# Patient Record
Sex: Female | Born: 1935 | Race: Black or African American | Hispanic: No
Health system: Southern US, Community
[De-identification: ages and names within clinical notes are randomized; demographics above are authoritative.]

---

## 2004-09-06 ENCOUNTER — Emergency Department: Payer: Self-pay | Admitting: Emergency Medicine

## 2004-09-06 ENCOUNTER — Other Ambulatory Visit: Payer: Self-pay

## 2004-09-17 ENCOUNTER — Ambulatory Visit: Payer: Self-pay | Admitting: Pain Medicine

## 2004-09-23 ENCOUNTER — Ambulatory Visit: Payer: Self-pay | Admitting: Pain Medicine

## 2004-10-31 ENCOUNTER — Ambulatory Visit: Payer: Self-pay | Admitting: Pain Medicine

## 2004-11-11 ENCOUNTER — Ambulatory Visit: Payer: Self-pay | Admitting: Pain Medicine

## 2004-12-17 ENCOUNTER — Ambulatory Visit: Payer: Self-pay | Admitting: Pain Medicine

## 2005-02-11 ENCOUNTER — Emergency Department: Payer: Self-pay | Admitting: Unknown Physician Specialty

## 2005-02-11 ENCOUNTER — Ambulatory Visit: Payer: Self-pay | Admitting: Pain Medicine

## 2005-03-05 ENCOUNTER — Ambulatory Visit: Payer: Self-pay | Admitting: Pain Medicine

## 2005-03-28 ENCOUNTER — Ambulatory Visit: Payer: Self-pay | Admitting: Unknown Physician Specialty

## 2005-04-03 ENCOUNTER — Ambulatory Visit: Payer: Self-pay | Admitting: Pain Medicine

## 2005-04-09 ENCOUNTER — Ambulatory Visit: Payer: Self-pay | Admitting: Pain Medicine

## 2005-05-09 ENCOUNTER — Emergency Department: Payer: Self-pay | Admitting: Internal Medicine

## 2005-05-22 ENCOUNTER — Ambulatory Visit: Payer: Self-pay | Admitting: Pain Medicine

## 2005-05-28 ENCOUNTER — Ambulatory Visit: Payer: Self-pay | Admitting: Pain Medicine

## 2005-06-02 ENCOUNTER — Ambulatory Visit: Payer: Self-pay | Admitting: Pain Medicine

## 2005-07-08 ENCOUNTER — Ambulatory Visit: Payer: Self-pay | Admitting: Pain Medicine

## 2005-07-21 ENCOUNTER — Ambulatory Visit: Payer: Self-pay | Admitting: Pain Medicine

## 2005-08-13 ENCOUNTER — Inpatient Hospital Stay: Payer: Self-pay | Admitting: Internal Medicine

## 2005-08-13 ENCOUNTER — Other Ambulatory Visit: Payer: Self-pay

## 2005-08-15 ENCOUNTER — Other Ambulatory Visit: Payer: Self-pay

## 2005-09-04 ENCOUNTER — Ambulatory Visit: Payer: Self-pay | Admitting: Pain Medicine

## 2005-09-10 ENCOUNTER — Ambulatory Visit: Payer: Self-pay | Admitting: Pain Medicine

## 2005-10-10 ENCOUNTER — Inpatient Hospital Stay: Payer: Self-pay | Admitting: Internal Medicine

## 2005-10-10 ENCOUNTER — Other Ambulatory Visit: Payer: Self-pay

## 2005-10-11 ENCOUNTER — Other Ambulatory Visit: Payer: Self-pay

## 2005-10-13 ENCOUNTER — Other Ambulatory Visit: Payer: Self-pay

## 2005-10-13 ENCOUNTER — Emergency Department: Payer: Self-pay | Admitting: Emergency Medicine

## 2006-02-27 ENCOUNTER — Ambulatory Visit: Payer: Self-pay | Admitting: Internal Medicine

## 2006-12-22 ENCOUNTER — Ambulatory Visit: Payer: Self-pay | Admitting: Pain Medicine

## 2007-01-04 ENCOUNTER — Ambulatory Visit: Payer: Self-pay | Admitting: Pain Medicine

## 2007-02-09 ENCOUNTER — Ambulatory Visit: Payer: Self-pay | Admitting: Pain Medicine

## 2007-02-17 ENCOUNTER — Ambulatory Visit: Payer: Self-pay | Admitting: Pain Medicine

## 2007-03-18 ENCOUNTER — Ambulatory Visit: Payer: Self-pay | Admitting: Pain Medicine

## 2007-03-22 ENCOUNTER — Ambulatory Visit: Payer: Self-pay | Admitting: Pain Medicine

## 2007-04-04 IMAGING — RF DG UGI W/ KUB
1 series · 14 of 24 positions shown · non-contrast
Comparison: none

REASON FOR EXAM: Dysphagia and Gerd
COMMENTS:

[Series 1: run · 14 of 31 slices shown]
[im 1/31]
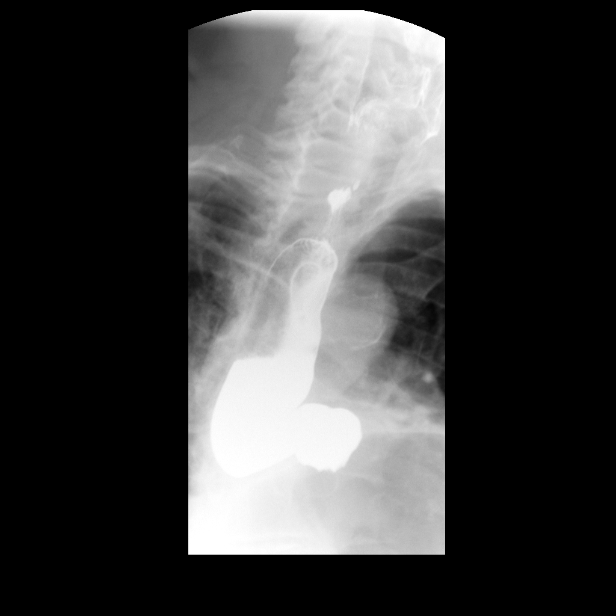
[im 3/31]
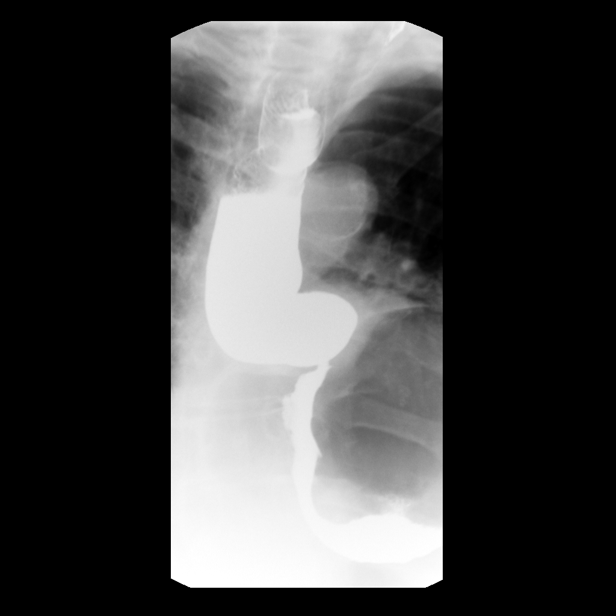
[im 6/31]
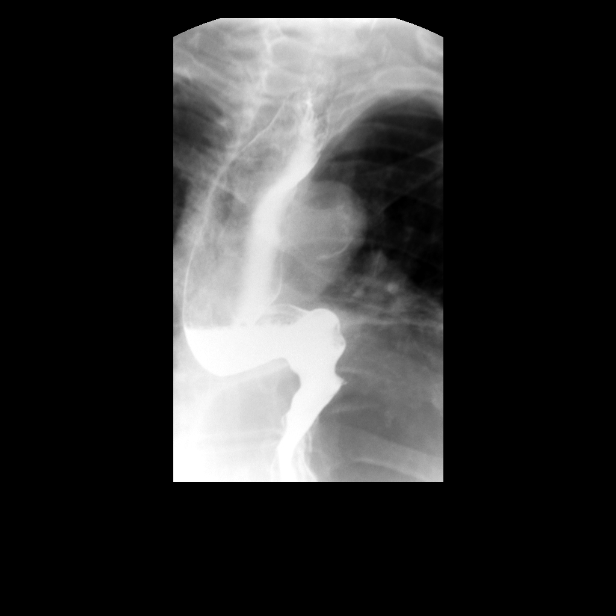
[im 8/31]
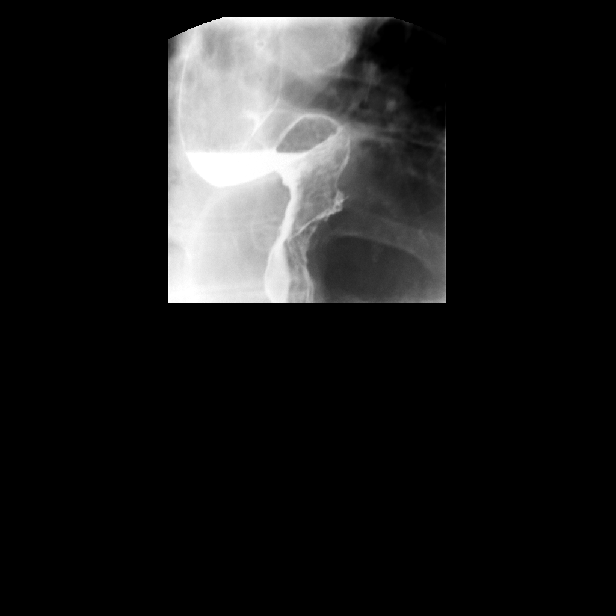
[im 10/31]
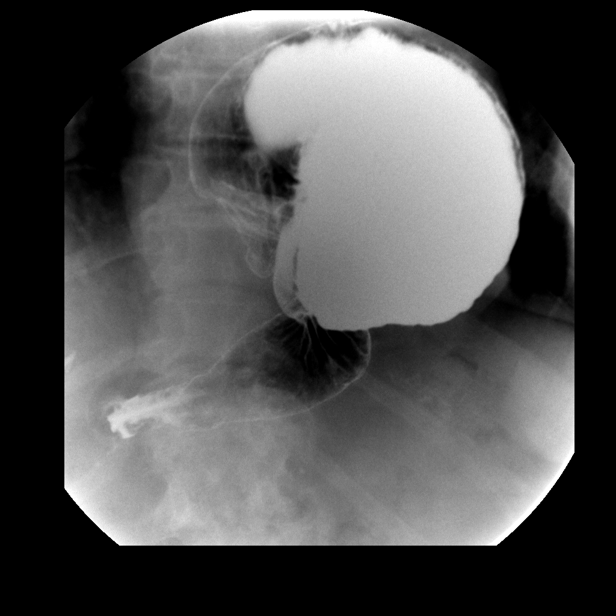
[im 12/31]
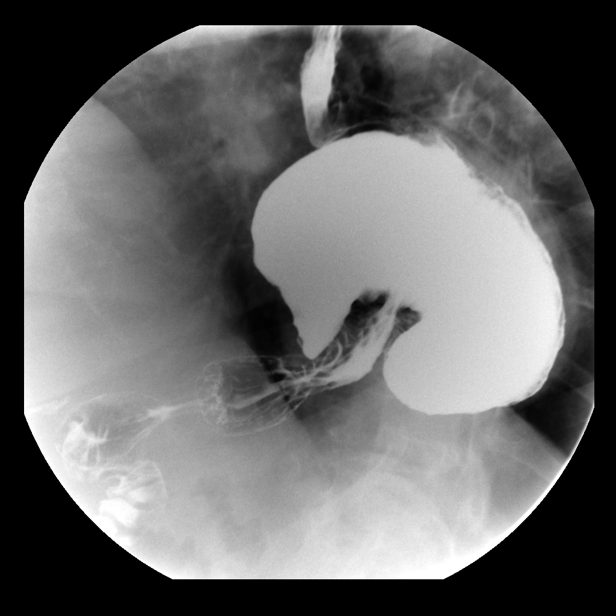
[im 15/31]
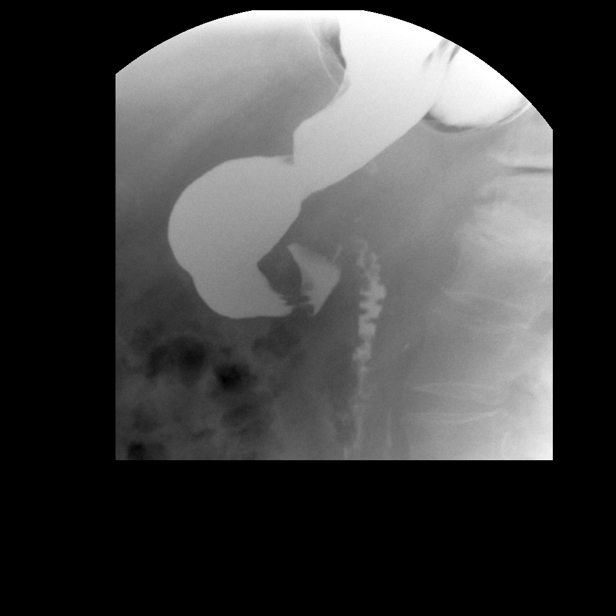
[im 16/31]
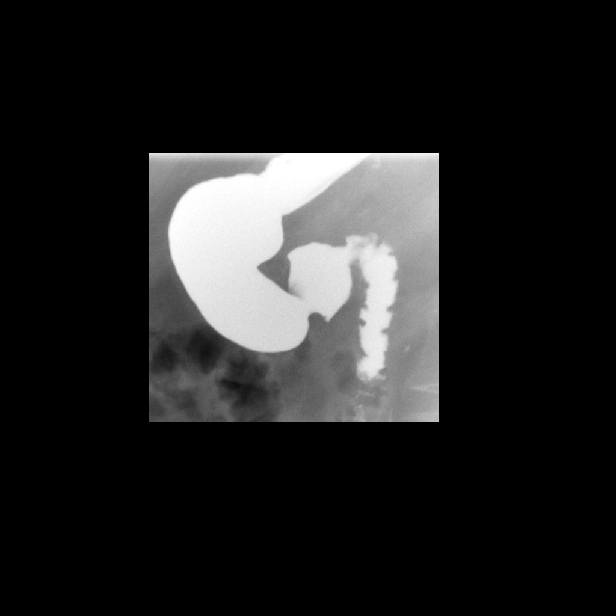
[im 19/31]
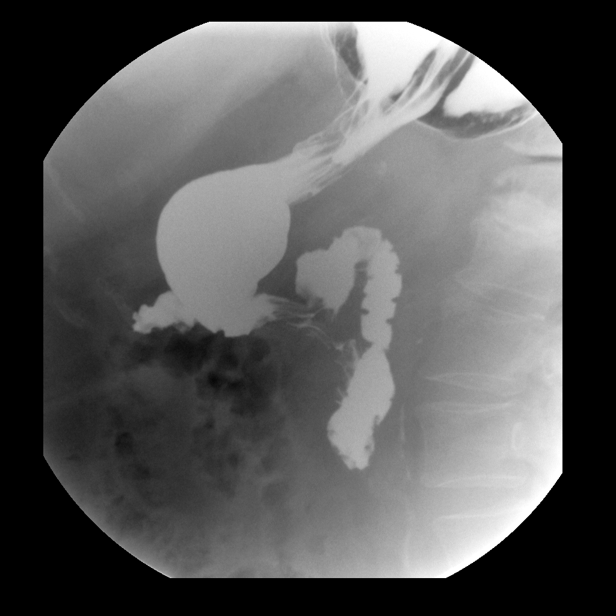
[im 21/31]
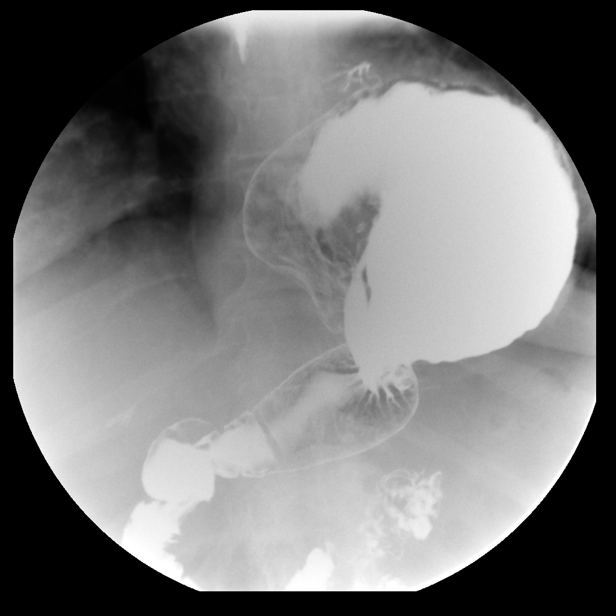
[im 24/31]
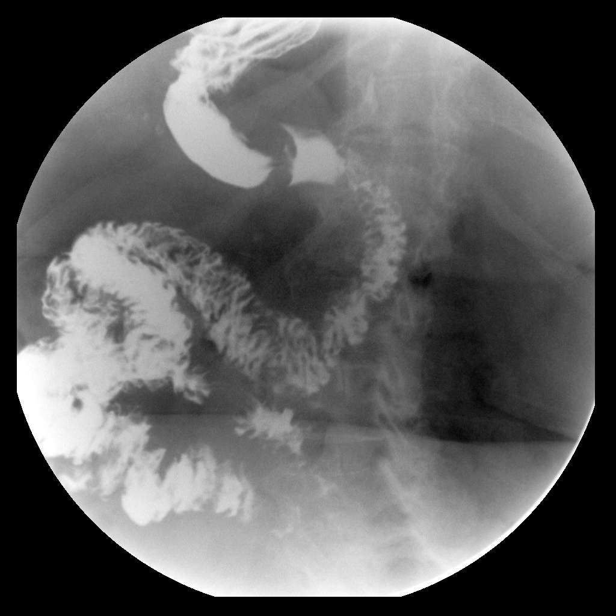
[im 25/31]
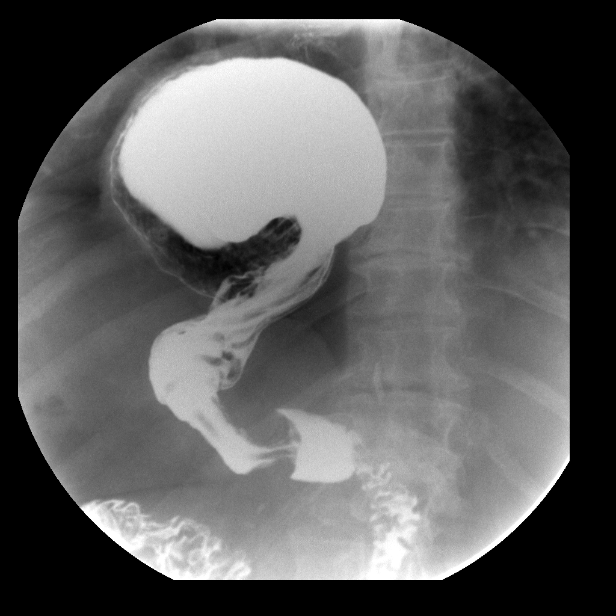
[im 28/31]
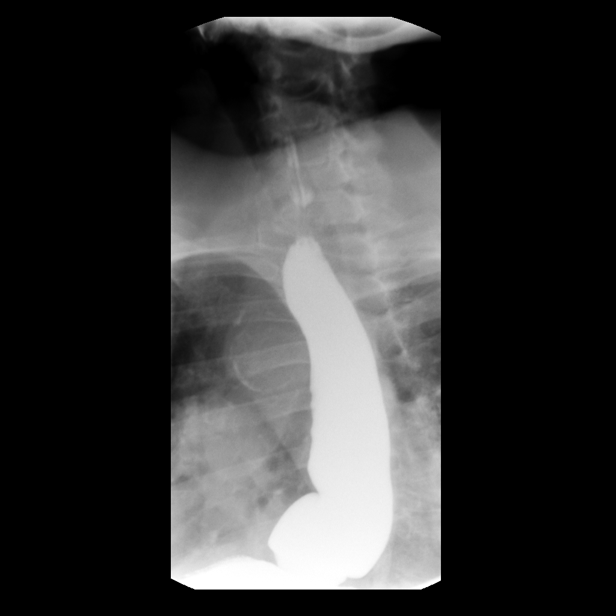
[im 31/31]
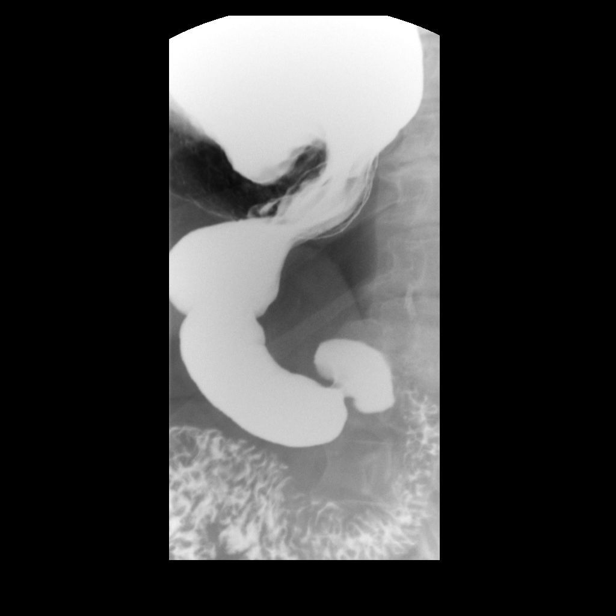

[14 of 24 positions shown; findings below may reference images not displayed]

PROCEDURE:     FL  - FL UPPER GI W/ BARIUM SWALLOW  - March 28, 2005  [DATE]

RESULT:        The patient was given oral barium status post administration
of effervescent crystals.  The esophagus was evaluated.  The esophagus is
patulous and it kinks within its distal portion.  This area of kinking is
secondary to a large hiatal hernia.  The hiatal hernia does demonstrate an
element of paraesophageal component.   The remaining evaluation of the
esophagus demonstrates no evidence of filling defects, strictural narrowing
or focal outpouchings.  The patient demonstrates marked esophageal
dysmotility with a poor to absent primary stripping wave, marked tertiary
contractions as well as marked to and fro motion within the esophagus during
swallowing.  There is appropriate relationship of the lower esophageal
sphincter.

Evaluation of the stomach as stated above demonstrates a large hiatal
hernia.  The cardiac fundus and portion of the body has herniated through
the esophageal hiatus of the diaphragm.  Visual evaluation of the stomach
demonstrates no gross radiographic abnormalities.  Specifically there is no
evidence of filling defect, strictural narrowing other than the narrowing at
the esophageal hiatus nor focal outpouchings.   Note, further evaluation
with direct visualization is recommended if clinically warranted.  The
duodenum and duodenal bulb demonstrate no radiographic abnormalities.
Appropriate rotation of the duodenum and appropriate placement of the
ligament of Treitz.
The patient demonstrated minimal gastroesophageal reflux with maneuvers were
performed to elicit reflux.
IMPRESSION: 1.     Large hiatal hernia as described above which appears to involve the
cardiac fundus and a portion of the body of the stomach.  This demonstrates
a paraesophageal component though the esophagus demonstrates primarily
kinking.
2.     The esophagus demonstrates marked dysmotility as well as evidence of
a patulous appearance.
3.     Minimal gastroesophageal reflux as described above.

## 2007-04-08 ENCOUNTER — Emergency Department: Payer: Self-pay | Admitting: Emergency Medicine

## 2007-04-08 ENCOUNTER — Other Ambulatory Visit: Payer: Self-pay

## 2007-04-20 ENCOUNTER — Ambulatory Visit: Payer: Self-pay | Admitting: Pain Medicine

## 2007-04-28 ENCOUNTER — Ambulatory Visit: Payer: Self-pay | Admitting: Pain Medicine

## 2007-06-08 ENCOUNTER — Ambulatory Visit: Payer: Self-pay | Admitting: Pain Medicine

## 2007-06-14 ENCOUNTER — Ambulatory Visit: Payer: Self-pay | Admitting: Pain Medicine

## 2007-07-15 ENCOUNTER — Ambulatory Visit: Payer: Self-pay | Admitting: Pain Medicine

## 2007-07-21 ENCOUNTER — Ambulatory Visit: Payer: Self-pay | Admitting: Pain Medicine

## 2007-08-30 ENCOUNTER — Ambulatory Visit: Payer: Self-pay | Admitting: Internal Medicine

## 2007-09-02 ENCOUNTER — Ambulatory Visit: Payer: Self-pay | Admitting: Pain Medicine

## 2007-09-15 ENCOUNTER — Ambulatory Visit: Payer: Self-pay | Admitting: Pain Medicine

## 2007-10-07 ENCOUNTER — Ambulatory Visit: Payer: Self-pay | Admitting: Pain Medicine

## 2007-11-09 ENCOUNTER — Ambulatory Visit: Payer: Self-pay | Admitting: Pain Medicine

## 2007-12-09 ENCOUNTER — Ambulatory Visit: Payer: Self-pay | Admitting: Pain Medicine

## 2008-01-06 ENCOUNTER — Ambulatory Visit: Payer: Self-pay | Admitting: Pain Medicine

## 2008-01-17 ENCOUNTER — Ambulatory Visit: Payer: Self-pay | Admitting: Pain Medicine

## 2008-02-02 ENCOUNTER — Ambulatory Visit: Payer: Self-pay | Admitting: Pain Medicine

## 2008-03-02 ENCOUNTER — Ambulatory Visit: Payer: Self-pay | Admitting: Pain Medicine

## 2008-03-08 ENCOUNTER — Ambulatory Visit: Payer: Self-pay | Admitting: Pain Medicine

## 2008-04-25 ENCOUNTER — Ambulatory Visit: Payer: Self-pay | Admitting: Pain Medicine

## 2008-06-08 ENCOUNTER — Ambulatory Visit: Payer: Self-pay | Admitting: Pain Medicine

## 2008-06-21 ENCOUNTER — Ambulatory Visit: Payer: Self-pay | Admitting: Pain Medicine

## 2008-07-05 ENCOUNTER — Ambulatory Visit: Payer: Self-pay | Admitting: Pain Medicine

## 2008-08-30 ENCOUNTER — Ambulatory Visit: Payer: Self-pay | Admitting: Pain Medicine

## 2008-10-03 ENCOUNTER — Ambulatory Visit: Payer: Self-pay | Admitting: Pain Medicine

## 2008-10-09 ENCOUNTER — Ambulatory Visit: Payer: Self-pay | Admitting: Pain Medicine

## 2009-03-21 ENCOUNTER — Inpatient Hospital Stay: Payer: Self-pay | Admitting: General Surgery

## 2009-03-31 ENCOUNTER — Ambulatory Visit: Payer: Self-pay | Admitting: Internal Medicine

## 2009-04-18 ENCOUNTER — Ambulatory Visit: Payer: Self-pay | Admitting: Internal Medicine

## 2009-05-01 ENCOUNTER — Ambulatory Visit: Payer: Self-pay | Admitting: Internal Medicine

## 2009-07-01 ENCOUNTER — Ambulatory Visit: Payer: Self-pay | Admitting: Internal Medicine

## 2009-07-18 ENCOUNTER — Ambulatory Visit: Payer: Self-pay | Admitting: Internal Medicine

## 2009-08-01 ENCOUNTER — Ambulatory Visit: Payer: Self-pay | Admitting: Internal Medicine

## 2009-08-02 ENCOUNTER — Ambulatory Visit: Payer: Self-pay | Admitting: Pain Medicine

## 2009-08-30 ENCOUNTER — Ambulatory Visit: Payer: Self-pay | Admitting: Pain Medicine

## 2009-09-05 ENCOUNTER — Ambulatory Visit: Payer: Self-pay | Admitting: Pain Medicine

## 2009-10-10 ENCOUNTER — Ambulatory Visit: Payer: Self-pay | Admitting: Pain Medicine

## 2009-10-31 ENCOUNTER — Ambulatory Visit: Payer: Self-pay | Admitting: Internal Medicine

## 2009-11-14 ENCOUNTER — Ambulatory Visit: Payer: Self-pay | Admitting: Internal Medicine

## 2009-12-01 ENCOUNTER — Ambulatory Visit: Payer: Self-pay | Admitting: Internal Medicine

## 2010-03-11 ENCOUNTER — Ambulatory Visit: Payer: Self-pay | Admitting: General Surgery

## 2010-04-24 ENCOUNTER — Ambulatory Visit: Payer: Self-pay | Admitting: Pain Medicine

## 2010-05-08 ENCOUNTER — Ambulatory Visit: Payer: Self-pay | Admitting: Pain Medicine

## 2010-05-28 ENCOUNTER — Ambulatory Visit: Payer: Self-pay | Admitting: Pain Medicine

## 2010-05-31 ENCOUNTER — Ambulatory Visit: Payer: Self-pay | Admitting: Internal Medicine

## 2010-06-24 ENCOUNTER — Ambulatory Visit: Payer: Self-pay | Admitting: Internal Medicine

## 2010-07-01 ENCOUNTER — Ambulatory Visit: Payer: Self-pay | Admitting: Internal Medicine

## 2010-07-24 ENCOUNTER — Ambulatory Visit: Payer: Self-pay | Admitting: Pain Medicine

## 2011-01-01 ENCOUNTER — Ambulatory Visit: Payer: Self-pay | Admitting: Urology

## 2011-01-10 ENCOUNTER — Ambulatory Visit: Payer: Self-pay | Admitting: Urology

## 2011-01-10 DIAGNOSIS — I10 Essential (primary) hypertension: Secondary | ICD-10-CM

## 2011-01-22 ENCOUNTER — Ambulatory Visit: Payer: Self-pay | Admitting: Urology

## 2011-04-02 ENCOUNTER — Ambulatory Visit: Payer: Self-pay

## 2011-05-21 ENCOUNTER — Ambulatory Visit: Payer: Self-pay | Admitting: Pain Medicine

## 2011-05-26 ENCOUNTER — Ambulatory Visit: Payer: Self-pay | Admitting: Pain Medicine

## 2011-06-09 ENCOUNTER — Ambulatory Visit: Payer: Self-pay | Admitting: Internal Medicine

## 2011-06-23 ENCOUNTER — Ambulatory Visit: Payer: Self-pay | Admitting: Pain Medicine

## 2011-07-02 ENCOUNTER — Ambulatory Visit: Payer: Self-pay | Admitting: Internal Medicine

## 2011-09-17 ENCOUNTER — Ambulatory Visit: Payer: Self-pay | Admitting: Pain Medicine

## 2011-09-22 ENCOUNTER — Ambulatory Visit: Payer: Self-pay | Admitting: Pain Medicine

## 2012-03-04 ENCOUNTER — Ambulatory Visit: Payer: Self-pay | Admitting: Unknown Physician Specialty

## 2012-04-07 ENCOUNTER — Ambulatory Visit: Payer: Self-pay | Admitting: Family

## 2012-06-08 ENCOUNTER — Ambulatory Visit: Payer: Self-pay | Admitting: Internal Medicine

## 2012-06-08 LAB — HEPATIC FUNCTION PANEL A (ARMC)
Alkaline Phosphatase: 104 U/L (ref 50–136)
Bilirubin, Direct: 0.1 mg/dL (ref 0.00–0.20)
Bilirubin,Total: 0.4 mg/dL (ref 0.2–1.0)
Total Protein: 6.7 g/dL (ref 6.4–8.2)

## 2012-06-08 LAB — CBC CANCER CENTER
Basophil %: 0.8 %
Eosinophil %: 4.9 %
HCT: 36.3 % (ref 35.0–47.0)
HGB: 11.6 g/dL — ABNORMAL LOW (ref 12.0–16.0)
MCH: 27.2 pg (ref 26.0–34.0)
MCHC: 31.9 g/dL — ABNORMAL LOW (ref 32.0–36.0)
MCV: 85 fL (ref 80–100)
Monocyte #: 0.4 x10 3/mm (ref 0.2–0.9)
Monocyte %: 8 %
Neutrophil #: 3.4 x10 3/mm (ref 1.4–6.5)
Neutrophil %: 64 %
RBC: 4.26 10*6/uL (ref 3.80–5.20)
WBC: 5.3 x10 3/mm (ref 3.6–11.0)

## 2012-06-08 LAB — CREATININE, SERUM
Creatinine: 1.99 mg/dL — ABNORMAL HIGH (ref 0.60–1.30)
EGFR (Non-African Amer.): 23 — ABNORMAL LOW

## 2012-07-01 ENCOUNTER — Ambulatory Visit: Payer: Self-pay | Admitting: Internal Medicine

## 2012-08-04 ENCOUNTER — Ambulatory Visit: Payer: Self-pay | Admitting: Pain Medicine

## 2012-09-01 ENCOUNTER — Ambulatory Visit: Payer: Self-pay | Admitting: Pain Medicine

## 2012-09-15 ENCOUNTER — Ambulatory Visit: Payer: Self-pay | Admitting: Pain Medicine

## 2013-02-27 ENCOUNTER — Emergency Department: Payer: Self-pay | Admitting: Emergency Medicine

## 2013-02-27 LAB — COMPREHENSIVE METABOLIC PANEL
Albumin: 3.2 g/dL — ABNORMAL LOW (ref 3.4–5.0)
Alkaline Phosphatase: 85 U/L (ref 50–136)
Anion Gap: 8 (ref 7–16)
Bilirubin,Total: 0.3 mg/dL (ref 0.2–1.0)
Co2: 24 mmol/L (ref 21–32)
Creatinine: 1.94 mg/dL — ABNORMAL HIGH (ref 0.60–1.30)
EGFR (African American): 27 — ABNORMAL LOW
Osmolality: 279 (ref 275–301)
Potassium: 4.5 mmol/L (ref 3.5–5.1)
SGOT(AST): 21 U/L (ref 15–37)
Total Protein: 6.6 g/dL (ref 6.4–8.2)

## 2013-02-27 LAB — LIPASE, BLOOD: Lipase: 95 U/L (ref 73–393)

## 2013-02-27 LAB — CBC
HCT: 30.4 % — ABNORMAL LOW (ref 35.0–47.0)
MCH: 27.5 pg (ref 26.0–34.0)
MCHC: 32.7 g/dL (ref 32.0–36.0)
MCV: 84 fL (ref 80–100)
WBC: 5.2 10*3/uL (ref 3.6–11.0)

## 2013-02-27 LAB — TROPONIN I: Troponin-I: <0.02 ng/mL

## 2013-04-20 ENCOUNTER — Ambulatory Visit: Payer: Self-pay | Admitting: Family

## 2013-05-31 ENCOUNTER — Ambulatory Visit: Payer: Self-pay | Admitting: Internal Medicine

## 2013-06-29 LAB — CREATININE, SERUM: Creatinine: 1.74 mg/dL — ABNORMAL HIGH (ref 0.60–1.30)

## 2013-06-29 LAB — CBC CANCER CENTER
Eosinophil %: 2.9 %
HGB: 11 g/dL — ABNORMAL LOW (ref 12.0–16.0)
Lymphocyte #: 0.9 x10 3/mm — ABNORMAL LOW (ref 1.0–3.6)
Lymphocyte %: 13.2 %
MCV: 84 fL (ref 80–100)
Monocyte %: 9.2 %
Neutrophil #: 5 x10 3/mm (ref 1.4–6.5)
Platelet: 319 x10 3/mm (ref 150–440)
RBC: 3.9 10*6/uL (ref 3.80–5.20)
RDW: 14 % (ref 11.5–14.5)
WBC: 6.8 x10 3/mm (ref 3.6–11.0)

## 2013-06-29 LAB — HEPATIC FUNCTION PANEL A (ARMC)
SGOT(AST): 21 U/L (ref 15–37)
SGPT (ALT): 28 U/L (ref 12–78)
Total Protein: 6.8 g/dL (ref 6.4–8.2)

## 2013-07-01 ENCOUNTER — Ambulatory Visit: Payer: Self-pay | Admitting: Internal Medicine

## 2013-07-23 ENCOUNTER — Inpatient Hospital Stay: Payer: Self-pay | Admitting: Internal Medicine

## 2013-07-23 LAB — URINALYSIS, COMPLETE
Nitrite: NEGATIVE
Ph: 6 (ref 4.5–8.0)
Protein: NEGATIVE
RBC,UR: 2 /HPF (ref 0–5)
Squamous Epithelial: 1
WBC UR: <1 /HPF (ref 0–5)

## 2013-07-23 LAB — COMPREHENSIVE METABOLIC PANEL
Albumin: 3.2 g/dL — ABNORMAL LOW (ref 3.4–5.0)
EGFR (Non-African Amer.): 34 — ABNORMAL LOW
Glucose: 154 mg/dL — ABNORMAL HIGH (ref 65–99)
Osmolality: 274 (ref 275–301)
Potassium: 4.3 mmol/L (ref 3.5–5.1)
Sodium: 134 mmol/L — ABNORMAL LOW (ref 136–145)
Total Protein: 6.7 g/dL (ref 6.4–8.2)

## 2013-07-23 LAB — CBC
HCT: 31.8 % — ABNORMAL LOW (ref 35.0–47.0)
HGB: 10.8 g/dL — ABNORMAL LOW (ref 12.0–16.0)
MCV: 83 fL (ref 80–100)
Platelet: 328 10*3/uL (ref 150–440)
RBC: 3.84 10*6/uL (ref 3.80–5.20)
WBC: 6.4 10*3/uL (ref 3.6–11.0)

## 2013-07-23 LAB — TROPONIN I: Troponin-I: <0.02 ng/mL

## 2013-07-23 LAB — CK TOTAL AND CKMB (NOT AT ARMC)
CK, Total: 97 U/L (ref 21–215)
CK-MB: 1.3 ng/mL (ref 0.5–3.6)

## 2013-07-24 DIAGNOSIS — I059 Rheumatic mitral valve disease, unspecified: Secondary | ICD-10-CM

## 2013-07-24 LAB — BASIC METABOLIC PANEL
BUN: 25 mg/dL — ABNORMAL HIGH (ref 7–18)
Calcium, Total: 9.1 mg/dL (ref 8.5–10.1)
Creatinine: 1.37 mg/dL — ABNORMAL HIGH (ref 0.60–1.30)
EGFR (Non-African Amer.): 35 — ABNORMAL LOW
Osmolality: 273 (ref 275–301)
Potassium: 4.4 mmol/L (ref 3.5–5.1)
Sodium: 129 mmol/L — ABNORMAL LOW (ref 136–145)

## 2013-07-24 LAB — CBC WITH DIFFERENTIAL/PLATELET
HCT: 32.1 % — ABNORMAL LOW (ref 35.0–47.0)
Lymphocyte %: 8.2 %
MCH: 27.5 pg (ref 26.0–34.0)
MCHC: 33 g/dL (ref 32.0–36.0)
Monocyte %: 1.1 %
Platelet: 330 10*3/uL (ref 150–440)
RBC: 3.85 10*6/uL (ref 3.80–5.20)
RDW: 14 % (ref 11.5–14.5)
WBC: 7.7 10*3/uL (ref 3.6–11.0)

## 2013-07-25 DIAGNOSIS — R079 Chest pain, unspecified: Secondary | ICD-10-CM

## 2013-07-25 LAB — CBC WITH DIFFERENTIAL/PLATELET
Basophil #: 0.1 10*3/uL (ref 0.0–0.1)
Basophil %: 0.3 %
Eosinophil #: 0 10*3/uL (ref 0.0–0.7)
HCT: 33 % — ABNORMAL LOW (ref 35.0–47.0)
HGB: 10.9 g/dL — ABNORMAL LOW (ref 12.0–16.0)
Lymphocyte %: 3.6 %
MCHC: 32.9 g/dL (ref 32.0–36.0)
MCV: 84 fL (ref 80–100)
Monocyte #: 0.6 x10 3/mm (ref 0.2–0.9)
Monocyte %: 3.6 %
Neutrophil #: 14.3 10*3/uL — ABNORMAL HIGH (ref 1.4–6.5)
Platelet: 335 10*3/uL (ref 150–440)
RBC: 3.93 10*6/uL (ref 3.80–5.20)
RDW: 13.9 % (ref 11.5–14.5)
WBC: 15.4 10*3/uL — ABNORMAL HIGH (ref 3.6–11.0)

## 2013-07-25 LAB — BASIC METABOLIC PANEL
Anion Gap: 11 (ref 7–16)
Calcium, Total: 9.3 mg/dL (ref 8.5–10.1)
Chloride: 95 mmol/L — ABNORMAL LOW (ref 98–107)
Co2: 22 mmol/L (ref 21–32)
Creatinine: 1.52 mg/dL — ABNORMAL HIGH (ref 0.60–1.30)
EGFR (African American): 36 — ABNORMAL LOW
Glucose: 313 mg/dL — ABNORMAL HIGH (ref 65–99)
Potassium: 4.6 mmol/L (ref 3.5–5.1)

## 2013-07-26 DIAGNOSIS — I509 Heart failure, unspecified: Secondary | ICD-10-CM

## 2013-07-26 LAB — CBC WITH DIFFERENTIAL/PLATELET
Basophil #: 0 10*3/uL (ref 0.0–0.1)
Eosinophil %: 0 %
HGB: 10.9 g/dL — ABNORMAL LOW (ref 12.0–16.0)
Lymphocyte %: 3.6 %
MCH: 27.4 pg (ref 26.0–34.0)
MCHC: 33.3 g/dL (ref 32.0–36.0)
MCV: 82 fL (ref 80–100)
Monocyte #: 0.6 x10 3/mm (ref 0.2–0.9)
Monocyte %: 3.8 %
Neutrophil #: 13.5 10*3/uL — ABNORMAL HIGH (ref 1.4–6.5)
Neutrophil %: 92.5 %
Platelet: 344 10*3/uL (ref 150–440)
RBC: 3.99 10*6/uL (ref 3.80–5.20)
RDW: 14 % (ref 11.5–14.5)

## 2013-07-26 LAB — BASIC METABOLIC PANEL
Anion Gap: 8 (ref 7–16)
BUN: 41 mg/dL — ABNORMAL HIGH (ref 7–18)
Calcium, Total: 9.2 mg/dL (ref 8.5–10.1)
Chloride: 95 mmol/L — ABNORMAL LOW (ref 98–107)
Co2: 26 mmol/L (ref 21–32)
EGFR (African American): 34 — ABNORMAL LOW
EGFR (Non-African Amer.): 30 — ABNORMAL LOW
Potassium: 4.2 mmol/L (ref 3.5–5.1)
Sodium: 129 mmol/L — ABNORMAL LOW (ref 136–145)

## 2013-07-27 DIAGNOSIS — R0602 Shortness of breath: Secondary | ICD-10-CM

## 2013-07-27 LAB — BASIC METABOLIC PANEL
Anion Gap: 7 (ref 7–16)
BUN: 50 mg/dL — ABNORMAL HIGH (ref 7–18)
Calcium, Total: 8.7 mg/dL (ref 8.5–10.1)
Chloride: 93 mmol/L — ABNORMAL LOW (ref 98–107)
Co2: 27 mmol/L (ref 21–32)
Creatinine: 1.73 mg/dL — ABNORMAL HIGH (ref 0.60–1.30)
Glucose: 239 mg/dL — ABNORMAL HIGH (ref 65–99)
Osmolality: 276 (ref 275–301)

## 2013-07-28 LAB — CULTURE, BLOOD (SINGLE)

## 2014-06-09 ENCOUNTER — Ambulatory Visit: Payer: Self-pay | Admitting: Internal Medicine

## 2014-06-29 ENCOUNTER — Ambulatory Visit: Payer: Self-pay | Admitting: Internal Medicine

## 2014-06-30 LAB — CBC CANCER CENTER
Basophil #: 0 x10 3/mm (ref 0.0–0.1)
Basophil %: 0.4 %
EOS ABS: 0.1 x10 3/mm (ref 0.0–0.7)
Eosinophil %: 1.6 %
HCT: 35.1 % (ref 35.0–47.0)
HGB: 11.2 g/dL — AB (ref 12.0–16.0)
LYMPHS ABS: 0.7 x10 3/mm — AB (ref 1.0–3.6)
Lymphocyte %: 7.6 %
MCH: 26 pg (ref 26.0–34.0)
MCHC: 31.8 g/dL — AB (ref 32.0–36.0)
MCV: 82 fL (ref 80–100)
MONOS PCT: 5.7 %
Monocyte #: 0.5 x10 3/mm (ref 0.2–0.9)
Neutrophil #: 7.8 x10 3/mm — ABNORMAL HIGH (ref 1.4–6.5)
Neutrophil %: 84.7 %
PLATELETS: 269 x10 3/mm (ref 150–440)
RBC: 4.3 10*6/uL (ref 3.80–5.20)
RDW: 14.8 % — AB (ref 11.5–14.5)
WBC: 9.2 x10 3/mm (ref 3.6–11.0)

## 2014-06-30 LAB — HEPATIC FUNCTION PANEL A (ARMC)
ALT: 47 U/L
Albumin: 2.8 g/dL — ABNORMAL LOW (ref 3.4–5.0)
Alkaline Phosphatase: 112 U/L
BILIRUBIN DIRECT: 0.1 mg/dL (ref 0.00–0.20)
Bilirubin,Total: 0.3 mg/dL (ref 0.2–1.0)
SGOT(AST): 28 U/L (ref 15–37)
TOTAL PROTEIN: 6 g/dL — AB (ref 6.4–8.2)

## 2014-06-30 LAB — CREATININE, SERUM
CREATININE: 2.23 mg/dL — AB (ref 0.60–1.30)
EGFR (African American): 23 — ABNORMAL LOW
EGFR (Non-African Amer.): 19 — ABNORMAL LOW

## 2014-07-01 ENCOUNTER — Ambulatory Visit: Payer: Self-pay | Admitting: Internal Medicine

## 2014-07-28 ENCOUNTER — Inpatient Hospital Stay: Payer: Self-pay | Admitting: Internal Medicine

## 2014-07-28 LAB — URINALYSIS, COMPLETE
BACTERIA: NONE SEEN
BILIRUBIN, UR: NEGATIVE
BLOOD: NEGATIVE
KETONE: NEGATIVE
LEUKOCYTE ESTERASE: NEGATIVE
Nitrite: NEGATIVE
PH: 6 (ref 4.5–8.0)
Protein: NEGATIVE
RBC,UR: <1 /HPF (ref 0–5)
Specific Gravity: 1.02 (ref 1.003–1.030)
Squamous Epithelial: 1
WBC UR: 2 /HPF (ref 0–5)

## 2014-07-28 LAB — CBC
HCT: 39 % (ref 35.0–47.0)
HGB: 12.1 g/dL (ref 12.0–16.0)
MCH: 25.8 pg — ABNORMAL LOW (ref 26.0–34.0)
MCHC: 31.1 g/dL — ABNORMAL LOW (ref 32.0–36.0)
MCV: 83 fL (ref 80–100)
PLATELETS: 209 10*3/uL (ref 150–440)
RBC: 4.7 10*6/uL (ref 3.80–5.20)
RDW: 15.9 % — ABNORMAL HIGH (ref 11.5–14.5)
WBC: 11.8 10*3/uL — AB (ref 3.6–11.0)

## 2014-07-28 LAB — TROPONIN I
TROPONIN-I: 0.1 ng/mL — AB
Troponin-I: 0.04 ng/mL
Troponin-I: 0.12 ng/mL — ABNORMAL HIGH

## 2014-07-28 LAB — COMPREHENSIVE METABOLIC PANEL
ALBUMIN: 2.7 g/dL — AB (ref 3.4–5.0)
ANION GAP: 10 (ref 7–16)
AST: 25 U/L (ref 15–37)
Alkaline Phosphatase: 122 U/L — ABNORMAL HIGH
BUN: 43 mg/dL — ABNORMAL HIGH (ref 7–18)
Bilirubin,Total: 0.5 mg/dL (ref 0.2–1.0)
CALCIUM: 9.2 mg/dL (ref 8.5–10.1)
CO2: 23 mmol/L (ref 21–32)
CREATININE: 2.16 mg/dL — AB (ref 0.60–1.30)
Chloride: 96 mmol/L — ABNORMAL LOW (ref 98–107)
EGFR (African American): 23 — ABNORMAL LOW
GFR CALC NON AF AMER: 20 — AB
GLUCOSE: 579 mg/dL — AB (ref 65–99)
Osmolality: 296 (ref 275–301)
POTASSIUM: 5.6 mmol/L — AB (ref 3.5–5.1)
SGPT (ALT): 47 U/L
Sodium: 129 mmol/L — ABNORMAL LOW (ref 136–145)
Total Protein: 5.9 g/dL — ABNORMAL LOW (ref 6.4–8.2)

## 2014-07-28 LAB — CK-MB: CK-MB: 6.5 ng/mL — ABNORMAL HIGH (ref 0.5–3.6)

## 2014-07-28 LAB — CK TOTAL AND CKMB (NOT AT ARMC)
CK, Total: 64 U/L
CK-MB: 7 ng/mL — ABNORMAL HIGH (ref 0.5–3.6)

## 2014-07-28 LAB — LIPASE, BLOOD: Lipase: 101 U/L (ref 73–393)

## 2014-07-29 LAB — BASIC METABOLIC PANEL
ANION GAP: 7 (ref 7–16)
BUN: 45 mg/dL — AB (ref 7–18)
CALCIUM: 8.1 mg/dL — AB (ref 8.5–10.1)
CO2: 24 mmol/L (ref 21–32)
CREATININE: 1.98 mg/dL — AB (ref 0.60–1.30)
Chloride: 108 mmol/L — ABNORMAL HIGH (ref 98–107)
EGFR (African American): 26 — ABNORMAL LOW
EGFR (Non-African Amer.): 22 — ABNORMAL LOW
GLUCOSE: 70 mg/dL (ref 65–99)
OSMOLALITY: 288 (ref 275–301)
POTASSIUM: 4.3 mmol/L (ref 3.5–5.1)
SODIUM: 139 mmol/L (ref 136–145)

## 2014-07-29 LAB — CK TOTAL AND CKMB (NOT AT ARMC)
CK, Total: 52 U/L
CK-MB: 6 ng/mL — ABNORMAL HIGH (ref 0.5–3.6)

## 2014-07-29 LAB — TROPONIN I: TROPONIN-I: 0.12 ng/mL — AB

## 2014-07-29 LAB — CLOSTRIDIUM DIFFICILE(ARMC)

## 2014-07-30 LAB — CBC WITH DIFFERENTIAL/PLATELET
BASOS ABS: 0 10*3/uL (ref 0.0–0.1)
BASOS PCT: 0.1 %
EOS ABS: 0.1 10*3/uL (ref 0.0–0.7)
Eosinophil %: 1.3 %
HCT: 33.6 % — AB (ref 35.0–47.0)
HGB: 10.9 g/dL — ABNORMAL LOW (ref 12.0–16.0)
LYMPHS PCT: 7.6 %
Lymphocyte #: 0.6 10*3/uL — ABNORMAL LOW (ref 1.0–3.6)
MCH: 26.7 pg (ref 26.0–34.0)
MCHC: 32.5 g/dL (ref 32.0–36.0)
MCV: 82 fL (ref 80–100)
MONOS PCT: 5.2 %
Monocyte #: 0.4 x10 3/mm (ref 0.2–0.9)
NEUTROS ABS: 6.4 10*3/uL (ref 1.4–6.5)
Neutrophil %: 85.8 %
Platelet: 155 10*3/uL (ref 150–440)
RBC: 4.08 10*6/uL (ref 3.80–5.20)
RDW: 16.6 % — ABNORMAL HIGH (ref 11.5–14.5)
WBC: 7.4 10*3/uL (ref 3.6–11.0)

## 2014-07-30 LAB — COMPREHENSIVE METABOLIC PANEL
ALBUMIN: 2 g/dL — AB (ref 3.4–5.0)
ALK PHOS: 73 U/L
ALT: 33 U/L
ANION GAP: 9 (ref 7–16)
BUN: 33 mg/dL — ABNORMAL HIGH (ref 7–18)
Bilirubin,Total: 0.4 mg/dL (ref 0.2–1.0)
CALCIUM: 7.8 mg/dL — AB (ref 8.5–10.1)
CREATININE: 2.34 mg/dL — AB (ref 0.60–1.30)
Chloride: 107 mmol/L (ref 98–107)
Co2: 21 mmol/L (ref 21–32)
EGFR (African American): 21 — ABNORMAL LOW
GFR CALC NON AF AMER: 18 — AB
GLUCOSE: 95 mg/dL (ref 65–99)
Osmolality: 281 (ref 275–301)
Potassium: 4.4 mmol/L (ref 3.5–5.1)
SGOT(AST): 26 U/L (ref 15–37)
Sodium: 137 mmol/L (ref 136–145)
TOTAL PROTEIN: 4.8 g/dL — AB (ref 6.4–8.2)

## 2014-07-31 LAB — BASIC METABOLIC PANEL
ANION GAP: 5 — AB (ref 7–16)
BUN: 30 mg/dL — ABNORMAL HIGH (ref 7–18)
CHLORIDE: 108 mmol/L — AB (ref 98–107)
Calcium, Total: 7.9 mg/dL — ABNORMAL LOW (ref 8.5–10.1)
Co2: 24 mmol/L (ref 21–32)
Creatinine: 2.08 mg/dL — ABNORMAL HIGH (ref 0.60–1.30)
EGFR (African American): 25 — ABNORMAL LOW
EGFR (Non-African Amer.): 21 — ABNORMAL LOW
Glucose: 179 mg/dL — ABNORMAL HIGH (ref 65–99)
OSMOLALITY: 284 (ref 275–301)
Potassium: 4.6 mmol/L (ref 3.5–5.1)
Sodium: 137 mmol/L (ref 136–145)

## 2014-08-01 LAB — CREATININE, SERUM
Creatinine: 1.85 mg/dL — ABNORMAL HIGH
EGFR (African American): 28 — ABNORMAL LOW
EGFR (Non-African Amer.): 24 — ABNORMAL LOW

## 2014-08-02 LAB — CULTURE, BLOOD (SINGLE)

## 2014-08-03 LAB — STOOL CULTURE

## 2015-01-28 ENCOUNTER — Emergency Department: Payer: Self-pay | Admitting: Emergency Medicine

## 2015-03-23 NOTE — Consult Note (Signed)
General Aspect 81 year old white female with history of hypertension, diabetes, chronic kidney disease stage III, history of COPD/asthma, sleep apnea, who uses CPAP on regular basis at nighttime, presents with progressive cough, and SOB.   Sx presented a few weeks ago, worse recently.  acute onset of shortness of breath the day of admission. on arrival,  she was  tachypneic and tachycardic and was  placed on  BiPAP.  She denies any abdominal pain, nausea, vomiting or diarrhea. Denies any urinary symptoms. Sh has been taking her inhalers at home with increasing frequency with minimal help in her breathing.    PAST MEDICAL HISTORY:  1.  COPD/asthma.  2.  Hypertension.  3.  Diabetes.  4.  Chronic kidney disease stage III. 5.  History left breast cancer status post mastectomy in 2010.  6.  Morbid obesity.  7.  GERD.  8.  Diminished hearing.  9.  Right-sided sciatica.  10.  Depression.  11.  Osteoarthritis.  12.  History of questionable TIA.   PAST SURGICAL HISTORY:  1.  Status post mastectomy.  2.  Status post bilateral cataract surgery.  3.  Bilateral total knee replacement.  4.  Status post stent to left ureter.   ALLERGIES: CIPRO, PAXIL AND CHEESE   Present Illness . SOCIAL HISTORY: No history of smoking or drinking. Lives at home with her daughter.   Family hx:   mother with CVA, otherwise no CAD   Physical Exam:  GEN well developed, well nourished, no acute distress   HEENT hearing intact to voice, moist oral mucosa   NECK supple   RESP normal resp effort  clear BS   CARD Regular rate and rhythm  Murmur   Murmur Systolic   ABD denies tenderness  soft   LYMPH negative neck   EXTR negative edema   SKIN normal to palpation   NEURO motor/sensory function intact   PSYCH alert, A+O to time, place, person, good insight        Admit Diagnosis:   ACUTE RENAL FAILURE: Onset Date: 25-Jul-2013, Status: Active, Description: ACUTE RENAL FAILURE  Home  Medications: Medication Instructions Status  O2 @ HS with CPAP  Active  Colace 100 mg oral capsule 1 cap(s) orally once a day, As Needed for softening stools. Active  acetaminophen 325 mg oral tablet 2 tabs ($Remov'650mg'LFaBcb$ ) orally every 4 hours as needed for pain/fever. Active  Dex4 oral tablet, chewable 4 tab(s) orally once IF SUGAR LESS THAN 70 Active  Advair Diskus 250 mcg-50 mcg inhalation powder 1 puff(s) inhaled 2 times a day Active  Ventolin HFA CFC free 90 mcg/inh inhalation aerosol 2 puff(s) inhaled 4 times a day as needed for shortness of breath/ wheezing.  Active  Carafate 1 g oral tablet 1 tab(s) orally 2 times a day Active  guaiFENesin 100 mg/5 mL oral liquid 5 milliliter(s) orally every 4 hours, As Needed for cough Active  optive - ophthalmic solution 1 drop(s) to each affected eye 3 times a day Active  omeprazole 20 mg oral delayed release capsule 1 cap(s) orally 2 times a day Active  Mylanta 400 mg-400 mg-40 mg/5 mL oral suspension 5 milliliter(s) orally 3 times a day, As Needed - for Indigestion, Heartburn Active  Voltaren Topical 1% topical gel Apply topically to affected area every 6 hours, As Needed - for Pain Active  Arimidex 1 mg tablet 1 tab(s) orally once a day (in the morning) Active  aspirin 81 mg oral tablet 1 tab(s) orally once a day  Active  Ativan 0.5 mg oral tablet 0.5 tab (0.25mg ) orally 2 times a day as needed for anxiety, nervousness Active  Caltrate 600 with D tablet 600 mg-400 intl units 1 tab(s) orally 2 times a day Active  Lantus 100 units/mL subcutaneous solution 25 unit(s) subcutaneous once a day Active  Lidoderm 5% topical film Apply topically to affected area once a day as needed for pain *12 hours on and 12 hours off* Active  Lumigan 0.03% ophthalmic solution 1 drop(s) to each affected eye once a day (at bedtime) Active  polyethylene glycol 3350 - oral powder for reconstitution 17 gram(s) in 8oz of fluid and drink orally once a day as needed for constipation.  Active  Metoprolol Succinate ER 50 mg oral tablet, extended release 1 tab(s) orally once a day (in the morning) Active  simvastatin 20 mg oral tablet 1 tab(s) orally once a day (at bedtime) Active  Zofran ODT 4 mg oral tablet, disintegrating 1 tab(s) orally every 6 hours as needed for nausea, vomiting. Active   Lab Results:  Routine Chem:  25-Aug-14 04:38   Glucose, Serum  313  BUN  38  Creatinine (comp)  1.52  Sodium, Serum  128  Potassium, Serum 4.6  Chloride, Serum  95  CO2, Serum 22  Calcium (Total), Serum 9.3  Anion Gap 11  Osmolality (calc) 278  eGFR (African American)  36  eGFR (Non-African American)  31 (eGFR values <27mL/min/1.73 m2 may be an indication of chronic kidney disease (CKD). Calculated eGFR is useful in patients with stable renal function. The eGFR calculation will not be reliable in acutely ill patients when serum creatinine is changing rapidly. It is not useful in  patients on dialysis. The eGFR calculation may not be applicable to patients at the low and high extremes of body sizes, pregnant women, and vegetarians.)  Routine Hem:  25-Aug-14 04:38   WBC (CBC)  15.4  RBC (CBC) 3.93  Hemoglobin (CBC)  10.9  Hematocrit (CBC)  33.0  Platelet Count (CBC) 335  MCV 84  MCH 27.6  MCHC 32.9  RDW 13.9  Neutrophil % 92.5  Lymphocyte % 3.6  Monocyte % 3.6  Eosinophil % 0.0  Basophil % 0.3  Neutrophil #  14.3  Lymphocyte #  0.6  Monocyte # 0.6  Eosinophil # 0.0  Basophil # 0.1 (Result(s) reported on 25 Jul 2013 at 05:28AM.)   EKG:  Interpretation EKG shows NSR with rate 75 bpm, LBBB   Radiology Results: Cardiology:    24-Aug-14 11:36, Echo Doppler  Echo Doppler   REASON FOR EXAM:      COMMENTS:       PROCEDURE: [facility] - ECHO DOPPLER COMPLETE(TRANSTHOR)  - Jul 24 2013 11:36AM     RESULT: Echocardiogram Report    Patient Name:   SNIGDHA HOWSER Date of Exam: 07/24/2013  Medical Rec #:  [id number]        Custom1:  Date ofBirth:  07-10-1927     Height:        60.0 in  Patient Age:    81 years      Weight:       219.0 lb  Patient Gender: F             BSA:          1.94 m??    Indications: SOB  Sonographer:    Nestor Ramp RCS  Referring Phys: [place] Bilberry, H    Summary:   1. Challenging image quality.  2. Left ventricular ejection fraction, by visual estimation, is 30 to   35%.   3. Moderate diffuse hypokinesis. Unable to exclude region wall motion   abnormality. Images suggestive of inferior and septal wall hypokinesis.   Anterior and lateral walls motion best preserved.   4. Normal right ventricular size and systolic function.   5. Mildly dilated left atrium.   6. Mild mitral valve regurgitation.   7. Mild tricuspid regurgitation.   8. Moderately elevated pulmonary artery systolic pressure.  2D AND M-MODE MEASUREMENTS (normal ranges within parentheses):  Left Ventricle:          Normal  IVSd (2D):      0.64 cm (0.7-1.1)  LVPWd (2D):     0.68 cm (0.7-1.1) Aorta/LA:                  Normal  LVIDd (2D):    4.62 cm (3.4-5.7) Aortic Root (2D): 2.70 cm (2.4-3.7)  LVIDs (2D):     4.06 cm           Left Atrium (2D): 4.60 cm (1.9-4.0)  LV FS (2D):     12.1 %   (>25%)  LV EF (2D):     26.3 %   (>50%)                                    Right Ventricle:                                 RVd (2D):        9.41 cm  LV SYSTOLIC FUNCTION BY 2D PLANIMETRY (MOD):  EF-A4C View: 33.8 % EF-A2C View: 32.2 % EF-Biplane: 74.0 %  LV DIASTOLIC FUNCTION:  MV Peak E: 1.33 m/s E/e' Ratio: 16.40  MV Peak A: 0.90 m/s Decel Time: 197 msec  E/A Ratio: 1.47  SPECTRAL DOPPLER ANALYSIS (where applicable):  Mitral Valve:  MV P1/2 Time: 57.13 msec  MV Area, PHT: 3.85 cm??  Tricuspid Valve and PA/RV Systolic Pressure: TR Max Velocity: 3.42 m/s RA   Pressure: 5 mmHg RVSP/PASP: 51.9 mmHg    PHYSICIAN INTERPRETATION:  Left Ventricle: The left ventricular internal cavity size was normal. LV   posterior wall thickness was normal. Left ventricular ejection  fraction,   by visual estimation, is 30 to 35%.  Right Ventricle: Normal right ventricular size, wall thickness, and   systolic function. The right ventricular size is normal. Global RV   systolic function is normal.  Left Atrium: The left atrium is mildly dilated.  Right Atrium: The right atrium is normal in size.  Pericardium: There is no evidence of pericardial effusion.  Mitral Valve: The mitral valve is normal in structure. Mild mitral valve   regurgitation is seen.  Tricuspid Valve: The tricuspid valve is normal. Mild tricuspid   regurgitation is visualized. The tricuspid regurgitant velocity is 3.42   m/s, and with an assumed right atrial pressure of 5 mmHg, the estimated   right ventricular systolic pressure is moderately elevated at 51.9 mmHg.  Aortic Valve: Mild aortic valve sclerosis is present, with no evidence of   aortic valve stenosis. No evidence of aortic valve regurgitation is seen.  Pulmonic Valve: The pulmonic valve is normal.  Aorta: The aortic root is normal in size and structure.    [id number] Ida Rogue MD  Electronically signed by [id number] Christia Reading  Bellina Tokarczyk MD  Signature Date/Time: 07/25/2013/8:55:30 AM    *** Final ***    IMPRESSION: .        Verified By: Minna Merritts, M.D., MD    Paxil: Alt Ment Status  Cipro: Hives  Cheese: N/V/Diarrhea  Vital Signs/Nurse's Notes: **Vital Signs.:   25-Aug-14 09:50  Vital Signs Type Q 4hr  Temperature Temperature (F) 98.5  Celsius 36.9  Temperature Source oral  Pulse Pulse 76  Respirations Respirations 20  Systolic BP Systolic BP 852  Diastolic BP (mmHg) Diastolic BP (mmHg) 79  Mean BP 110  Pulse Ox % Pulse Ox % 99  Pulse Ox Activity Level  At rest  Oxygen Delivery 2L    Impression 81 year old white female with history of hypertension, diabetes, chronic kidney disease stage III, history of COPD/asthma, sleep apnea, who uses CPAP on regular basis at nighttime, presents with progressive cough, and SOB.   1)  Shortness of breath suspect multifactorial:  acute on chronic systolic and diastolic CHF, being treated for COPD (though no smoking hx) --Moderate pulmonary HTN on echo, also with EF 30% Suspect underlying CAD --Would continue gentle diuresis  (given her renal dysfunction) For rise in creatinine, could decrease dosing of lasix  2) HTN: May benefit from additional of other agents such as amlodipine   3) Cardiomyopathy: suspect ischemic. Long hx of diabetes: long discussion with patient and daughter They prefer medical management at this time, rather than stress testing and cardiac cath --Could rediscuss with patient as we go, reconsider additional testing if she has worsening sx --continue asa, b-blocker, statin  4) Diabetes: per medfical service  5) OSA: on CPAP   Electronic Signatures: Ida Rogue (MD)  (Signed 25-Aug-14 19:44)  Authored: General Aspect/Present Illness, History and Physical Exam, Health Issues, Home Medications, Labs, EKG , Radiology, Allergies, Vital Signs/Nurse's Notes, Impression/Plan   Last Updated: 25-Aug-14 19:44 by Ida Rogue (MD)

## 2015-03-23 NOTE — H&P (Signed)
PATIENT NAME:  Andrea Vaughan, [name] MR#:  [id number] DATE OF BIRTH:  1927-07-21  DATE OF ADMISSION:  07/23/2013  PRIMARY CARE PROVIDER: Dr. Alberteen SpindleAllen Colette   EMERGENCY ROOM PHYSICIAN:  Dr.  Minna AntisKevin Paduchowski   CHIEF COMPLAINT: Shortness of breath, acute respiratory failure.   HISTORY OF PRESENT ILLNESS: The patient is an 81 year old white female with history of hypertension, diabetes, chronic kidney disease stage III, history of COPD/asthma, sleep apnea, who uses CPAP on regular basis at nighttime, presents with progressive cough, ongoing for the past few weeks. The patient also started developing acute onset of shortness of breath this morning. When patient arrived, she was very tachypneic and tachycardic. She had to be placed on a BiPAP. The patient reports no fevers or chills. She does report occasional chest pain on the left side of her chest. The patient reports that she has also fallen a few times over the past month with activity. She denies any abdominal pain, nausea, vomiting or diarrhea. Denies any urinary symptoms. She does complain of wheezing.   PAST MEDICAL HISTORY:  Significant for:  1.  COPD/asthma.  2.  Hypertension.  3.  Diabetes.  4.  Chronic kidney disease stage III. 5.  History left breast cancer status post mastectomy in 2010.  6.  Morbid obesity.  7.  GERD.  8.  Diminished hearing.  9.  Right-sided sciatica.  10.  Depression.  11.  Osteoarthritis.  12.  History of questionable TIA.   PAST SURGICAL HISTORY:  1.  Status post mastectomy.  2.  Status post bilateral cataract surgery.  3.  Bilateral total knee replacement.  4.  Status post stent to left ureter.   ALLERGIES: CIPRO, PAXIL AND CHEESE    CURRENT MEDICATIONS: At home Tylenol 650 q. 4 p.r.n. for pain, Advair 250/50 INH b.i.d., albuterol 2 puffs 4 times a day as needed, Arimidex 1 mg daily, aspirin 81 mg 1 tab p.o. daily, Ativan 0.5, one-half tab p.o. b.i.d. as needed, Caltrate 600 mg 1 tab p.o. b.i.d., Carafate 1  gram b.i.d., Colace 100 one tab p.o. daily p.r.n., Dex4 four tablets if blood sugar less than 70, guaifenesin 5 mL q. 4 p.r.n., Lantus 25 at bedtime, Lidoderm patch as needed, Lumigan 0.03% ophthalmic solution 1 drop to each affected eye at bedtime, Mylanta 5 mL t.i.d., nystatin apply to topical areas 2 times a day, omeprazole 20 mg one tab p.o. b.i.d., Optive ophthalmic solution 1 drop to affected eye 3 times a day, Pepto-Bismol 2 tabs 4 times a day as needed, MiraLAX 17 grams daily, Prozac 40 daily, Toprol-XL 50 daily, Ventolin 2 puffs 4 times a day as needed,  Voltaren gel q. 6 p.r.n., Zocor 20.   SOCIAL HISTORY: No history of smoking or drinking. Lives at home with her daughter.   REVIEW OF SYSTEMS:  CONSTITUTIONAL: Denies any fevers. Complains of fatigue and weakness. No weight loss. No weight gain.  EYES: No blurred or double vision. Has history of cataracts. The patient has conjunctival hemorrhage in the right eye since being here in the ED.  ENT: Denies any tinnitus, ear pain. He has some hearing loss. No seasonal or year-round allergies. No epistaxis. No difficulty swallowing.  RESPIRATORY: Complains of dry cough, wheezing. No hemoptysis. Has chronic dyspnea. Has a history of asthma/COPD. No history of smoking.  CARDIOVASCULAR: Complains of occasional left-sided chest pain. No orthopnea. Occasional edema. No arrhythmia. Complains of dyspnea on exertion. No palpitations.  GASTROINTESTINAL: No nausea, vomiting, diarrhea. No abdominal pain. No hematemesis.  GENITOURINARY: Denies any dysuria, hematuria, renal calculus or frequency.  ENDOCRINE: Denies any polyuria, nocturia or thyroid problems.  HEMATOLOGIC AND LYMPHATIC: Denies any major bruisability or bleeding.  SKIN: No acne. No rash. No changes in mole, hair or skin.  MUSCULOSKELETAL: Has pain in her back related to sciatica.  NEUROLOGIC:  No numbness. No CVA. Questionable history of TIA.  PSYCHIATRIC: No anxiety. No insomnia. No ADD.    PHYSICAL EXAMINATION: VITAL SIGNS: Temperature 99, pulse 75, respirations 22, blood pressure 186/102.  GENERAL: The patient is a frail-looking female in respiratory distress on BiPAP.  HEENT: Head atraumatic, normocephalic. Pupils equally round, reactive to light and accommodation. She has right-sided conjunctival hemorrhage. Extraocular movements intact. Nasal exam shows no nasal lesions or drainage. Ears: No drainage or external lesions. Mouth is moist without any exudate.  NECK: Supple and symmetric. No masses. No carotid bruits.  RESPIRATORY: She has got bilateral wheezing. There are no rales.  CARDIOVASCULAR: Regular rate and rhythm. No murmurs, clicks or gallops.  ABDOMEN: Soft, nontender, nondistended. Positive bowel sounds x4. No hepatosplenomegaly.  GENITOURINARY: Deferred.  MUSCULOSKELETAL: There is no erythema or swelling.  SKIN: No rash.  LYMPHATICS: No lymph nodes palpable.  VASCULAR: Good DP, PT pulses.  NEUROLOGIC: The patient is awake, alert and oriented. No focal deficits.  PSYCHIATRIC: Not anxious or depressed.   EVALUATIONS: Chest x-ray is suggestive of possible low-grade CHF with mild interstitial edema. Her glucose is 154, BUN 20, creatinine 1.43, sodium 134, potassium 4.3, chloride 99, CO2 is 29, calcium 9.1. LFTs are normal except albumin of 3.2. CPK is 97, CK-MB 1.3. Troponin less than 0.02. WBC 6.4, hemoglobin 10.8, platelet count 328.   ASSESSMENT AND PLAN: The patient is an 81 year old African American female with history of chronic obstructive pulmonary disease/asthma, sleep apnea, presents with shortness of breath, noted to have acute respiratory failure.   1.  Acute on chronic respiratory failure due to acute on chronic obstructive pulmonary disease exacerbation and possible pneumonia. Also, there is concern for possible congestive heart failure. We will go ahead and check a BNP level and an echocardiogram. Will treat her with nebulizers, IV steroids and IV  antibiotics. Monitor her respiratory status. I will also ask  pulmonary to see the patient.  2.  Diabetes. We will place the patient on sliding scale, hold insulin.  3.  Chronic renal failure. We will monitor renal failure, avoid nephrotoxins.  4.  Accelerated hypertension. We will continue metoprolol as taking at home. I will add p.r.n. hydralazine to her current regimen.  5.  Right-sided subconjunctival hemorrhage. Will monitor for any further worsening or any visual difficulties.  6.  History of breast cancer. Continue anastrozole as taking at home.  7.  Depression: Continue fluoxetine as taking at home.  8.  Hyperlipidemia. Continue simvastatin.  9.  Gastroesophageal reflux disease. We will continue omeprazole.   CODE STATUS: I discussed with the patient that she wishes DO NOT RESUSCITATE status. She does not want to be intubated or resuscitated based on her age which we will honor. We will make the patient DO NOT RESUSCITATE   Critical care time spent: 50 minutes in light of patient's acute respiratory failure.     ____________________________ Lacie Scotts. Allena Katz, MD shp:dp D: 07/23/2013 11:51:25 ET T: 07/23/2013 13:25:50 ET JOB#: [id number]  cc: [facility] Allena Katz, MD, <Dictator> Charise Carwin MD ELECTRONICALLY SIGNED 07/26/2013 18:24

## 2015-03-23 NOTE — Consult Note (Signed)
PATIENT NAME:  Andrea Vaughan, Sharlyn C MR#:  [id number] DATE OF BIRTH:  Mar 26, 1927  DATE OF CONSULTATION:  02/27/2013  REFERRING PHYSICIAN:   CONSULTING PHYSICIAN:  Jamyla Ard R. Sreekar Broyhill, MD  PRIMARY CARE PHYSICIAN:  Dr. Doylene Canninghoksi.  CHIEF COMPLAINT: Epigastric and lower chest burning.   HISTORY OF PRESENTING ILLNESS: An 81 year old Caucasian female patient with history of hypertension, diabetes, CKD stage III with no prior coronary artery disease, presents to the Emergency Room brought in by her daughter complaining of epigastric burning pain going to the lower part of the midsternal chest. The patient does not complain of any shortness of breath but had some nausea. No palpitations, lightheadedness, syncope, lower extremity swelling. The patient does have some GERD, is on the Prilosec for the same with on and off epigastric pain. The patient's cardiac enzymes in the Emergency Room are normal. A consult has been placed for hospitalist for the lower chest burning pain although the pain seems to radiate from her epigastric area and also goes down to the periumbilical area. EKG shows left bundle branch block which is same as compared to an EKG from 2012. Cardiac enzymes are normal. Chest x-ray shows no acute infiltrate. The patient is not hypoxic or tachycardic, and no lower extremity swelling or calf tenderness.   PAST MEDICAL HISTORY: 1.  Hypertension.  2.  Diabetes mellitus.  3.  CKD, stage III,  4.  Left breast cancer, status post mastectomy in 2010 5.  Obesity.  6.  GERD.  7.  Mildly decreased hearing.  8.  Right sciatica.  9.  Depression.  10.  Hyperlipidemia.   FAMILY HISTORY: History of lymphoma and diabetes.   SOCIAL HISTORY: The patient does not smoke. No alcohol. No illicit drugs. Lives at home with her daughter.   ALLERGIES: CIPROFLOXACIN, PAXIL.   HOME MEDICATIONS: 1.  Acetaminophen 650 mg oral every 4 hours as needed.  2.  Advair Diskus 250/50 inhaled 2 times a day.  3.  Albuterol 2 puffs  inhaled 4 times a day as needed.  4.  Aspirin 81 mg oral once a day.  5.  Calcium with vitamin D 1 tablet oral 2 times a day.  6.  Colace 100 mg oral once a day.  7.  Humulin 70/30, 25 units once a day in the morning and 10 units at supper.  8.  Lisinopril 10 mg oral once a day.  9.  Metamucil as needed once a day.  10.  Norco 5/325, 1 tablet oral every 6 hours as needed.  11.  Toprol-XL 50 mg oral once a day.  12.  Tramadol 50 mg oral 2 times a day as needed.  13.  Zocor 20 mg oral daily.  14.  Zyrtec 10 mg 1 tablet oral once a day as needed.   REVIEW OF SYSTEMS:  CONSTITUTIONAL: No fatigue, fever, weight loss, weight gain.  EYES: No blurred vision, pain or redness.  ENT: No tinnitus, ear pain. Does have some mild hearing loss.  RESPIRATORY: No cough, shortness of breath or wheezing.  CARDIOVASCULAR: Complains of the burning in lower chest. No left-sided chest pain, PND, orthopnea, edema,  GASTROINTESTINAL: Has some nausea, epigastric pain and pain from her GERD. No melena or diarrhea.  GENITOURINARY: No frequency, hematuria, dysuria.  MUSCULOSKELETAL: Has arthritis back pain.  NEUROLOGIC: No focal numbness, weakness or seizures.   PHYSICAL EXAMINATION: VITAL SIGNS:  Temperature 98.4, pulse 73, blood pressure 168/82, saturating 100% on oxygen.  GENERAL: Obese Caucasian female patient lying in bed, comfortable,  conversational, cooperative with exam.  PSYCHIATRIC: Alert, oriented x 3. Mood and affect appropriate. Judgment intact.  HEENT: Atraumatic, normocephalic. Oral mucosa moist and pink. External ears and nose normal. No pallor. No icterus. Pupils bilaterally equal and to react light.  NECK: Supple. No thyromegaly. No palpable lymph nodes. Trachea midline. No carotid bruits or JVD.  CARDIOVASCULAR: S1 and S2 heard without any murmurs. Peripheral pulses 2+.  GI: Has epigastric tenderness. No rigidity or guarding. Bowel sounds present. No hepatosplenomegaly palpable. Murphy's sign  negative.  GENITOURINARY: No CVA tenderness or bladder distention.  SKIN: Warm and dry. No petechiae, rash, ulcers.  MUSCULOSKELETAL: No joint swelling, redness, effusion of the large joints. Normal muscle tone.  NEUROLOGICAL: Motor strength 5/5 in upper and lower extremities. Sensation to pin is intact all over.  Cranial nerves II through XII intact.    LABORATORY DATA:   1.  Glucose 200, BUN 26, creatinine 1.94, sodium 134. AST, ALT, alkaline phosphatase normal. Troponin less than 0.02. WBC 5.2, hemoglobin 9.9, platelets of 216.  2.  EKG shows left bundle branch block with no acute changes compared to prior EKG,  3.  Chest x-ray shows chronic changes from COPD, no acute infiltrates or pulmonary edema found.   ASSESSMENT AND PLAN: 1.  Epigastric pain radiating to lower midsternal chest. Likely secondary from the gastroesophageal reflux disease patient has.  I have advised the patient increase her Prilosec from once to twice a day. Will get a second set of cardiac enzymes, if that is normal the patient is okay to be discharged home to follow up with her primary care physician. No history of coronary artery disease.  2.  Uncontrolled hypertension. Continue the patient's ACE inhibitor, beta blocker. Mildly elevated at this time likely secondary from the pain.  3.  Chronic kidney disease, stage III. Creatinine of 1.9 seems to be baseline looking at her labs from 2013.  4.  Diabetes mellitus. Continue insulin doses at home.  5.  CODE STATUS: Full code.   I have discussed the case with Dr.Braud of the Emergency Room. The patient can be discharged home if her second cardiac enzymes are in the normal range with doubling the dose of PPI.   TIME SPENT: Time spent today on this consult was 40 minutes.      ____________________________ Molinda Bailiff Zohair Epp, MD srs:cs D: 02/27/2013 19:46:00 ET T: 02/27/2013 20:43:34 ET JOB#: [id number]  cc: Wardell Heath R. Joseff Luckman, MD, <Dictator> Orie Fisherman  MD ELECTRONICALLY SIGNED 03/07/2013 15:06

## 2015-03-23 NOTE — Discharge Summary (Signed)
PATIENT NAME:  Andrea KernsYLOR, Larene C MR#:  [id number] DATE OF BIRTH:  1927/08/16  DATE OF ADMISSION:  07/23/2013 DATE OF DISCHARGE:  07/27/2013  PRIMARY CARE PHYSICIAN: Alberteen Spindleolette Allen, NP at [facility]   DISCHARGE DIAGNOSES: 1.  Acute respiratory failure secondary to chronic obstructive pulmonary disease exacerbation.  2.  Chronic systolic congestive heart failure.  3.  Hyponatremia.  4.  Diabetes.  5.  Chronic kidney disease.  6.  Accelerated hypertension.  7.  History of breast cancer.  8.  Depression.  9.  Hyperlipidemia.  10.  Gastroesophageal reflux disease.  11.  Paroxysmal atrial fibrillation.  12.  Subconjunctival hemorrhage.  13.  Weakness.  14.  Thrush.   MEDICATIONS UPON DISCHARGE: Include oxygen at night with CPAP, Colace 100 mg daily as needed for constipation, acetaminophen 325 mg 2 tablets every 4 hours as needed for pain or fever, Dex4 oral tablet chewable 4 tablets orally if sugar is less than 70, Advair Diskus 250/50, 1 puff twice a day, Ventolin HFA 2 puffs 4 times a day as needed for shortness of breath or wheezing, Carafate 1 gram twice a day, Optive ophthalmic solution 1 drop each eye 3 times a day, omeprazole 20 mg twice a day, Mylanta 5 mL 3 times a day as needed for heartburn, Voltaren topical 1% topical gel apply to affected area every 6 hours as needed for pain, Arimidex 1 mg in the morning, aspirin 81 mg daily, Ativan 0.5 mg 1/2 tablet twice a day as needed for anxiety, nervousness, Caltrate with D 600/400, 1 tablet twice a day, Lidoderm patch 5%, apply for 12 hours daily, Lumigan 0.03 ophthalmic solution 1 drop affected eye at bedtime, MiraLax 17 grams in 8 ounces of fluid daily as needed for constipation, metoprolol ER 50 mg daily, simvastatin 20 mg daily, prednisone 5 mg 3 tablets day 1, 2 tablets days 2 and 3, 1 tablet days 4 and 5 and then stop, Imdur 30 mg extended-release daily, Lantus 25 units subcutaneous injection at bedtime, nystatin 5 mL every 6 hours until  completed, Lasix 20 mg daily, hydralazine 25 mg 3 times a day, azithromycin 500 mg once a day for 2 more days, Ceftin 500 mg twice a day until completed.   DIET: Low sodium, carbohydrate-controlled diet, regular consistency.   ACTIVITY: As tolerated.   FOLLOWUP: Follow up with [facility]. Follow up with  Alberteen Spindleolette Allen in 1 to 2 weeks.   REASON FOR ADMISSION: The patient was admitted 07/23/2013 and discharged 07/27/2013, came in with shortness of breath, acute respiratory failure, admitted with acute on chronic respiratory failure secondary to COPD exacerbation and possible heart failure.   LABORATORY AND RADIOLOGICAL DATA DURING THE HOSPITAL COURSE: Included an EKG that showed normal sinus rhythm, left bundle branch block.  BNP 4850. Troponin negative. White blood cell count 6.4, H and H 10.8 and 31.8, platelet count of 328. Glucose 154, BUN 20, creatinine 1.43, sodium 134, potassium 4.3, chloride 99, CO2 29, calcium 9.1. Liver function tests normal range.  Chest x-ray showed findings suggestive of low-grade CHF, mild interstitial edema predominantly on the right.  Blood cultures negative. Urinalysis negative.  Echocardiogram showed challenging in quality, EF 30 to 35%, moderate diffuse hypokinesia, moderately elevated pulmonary arterial systolic pressure.  Creatinine upon discharge 1.73, sodium 127.  Pulse ox room air 94% with exertion.   HOSPITAL COURSE PER PROBLEM LIST:  1.  For the patient's acute respiratory failure secondary to chronic obstructive pulmonary disease exacerbation: The patient was  started on IV Solu-Medrol, Rocephin and Zithromax. The patient was on oxygen during the entire hospital course until the day of discharge where she was tested and did not require the oxygen. Her steroid was switched over to oral prednisone for a taper, and she was switched over to Zithromax and Ceftin upon discharge. She already has her Advair Diskus and Ventolin inhaler. The patient's  respiratory status improved prior to discharge.  2.  Chronic systolic congestive heart failure with low ejection fraction: I did not prescribe an ACE inhibitor secondary to creatinine elevation. Can consider this as outpatient if creatinine is stable. We did use hydralazine and Imdur along with her metoprolol ER. The patient's lungs were clear upon discharge.  3.  Hyponatremia: This was chronic during the entire hospitalization, likely secondary to chronic systolic heart failure. Watch as outpatient. Recommend a BMP with follow-up appointment.  4.  Diabetes: Sugars were high being on the steroids. I kept her on her usual Lantus dose.  5.  Chronic kidney disease:  Need to watch with diuresis. Creatinine upon discharge 1.73. Recommend following a BMP as outpatient. The patient's GFR upon discharge is 31, making this chronic kidney disease, stage 3.  6.  Accelerated hypertension: May be the cause of the patient's heart failure. The patient's blood pressure upon discharge 156/72.  7.  Depression.  8.  Hyperlipidemia: On simvastatin.  9.  Gastroesophageal reflux disease: On Carafate and omeprazole.  10.  Paroxysmal atrial fibrillation: Cardiology did not feel that it was a good idea to start anticoagulation in this patient. The patient is only on aspirin for anticoagulation.  11.  Subconjunctival hemorrhage: Probably secondary to coughing. Monitoring is all that is needed.  12.  Weakness:  Can follow up with [facility].  13.  Thrush: Likely secondary to the steroids. Nystatin swish and swallow prescribed.   TIME SPENT ON DISCHARGE: 40 minutes.   ____________________________ Herschell Dimes. Renae Gloss, MD rjw:cb D: 07/28/2013 16:01:21 ET T: 07/28/2013 20:35:46 ET JOB#: [id number]  cc: Herschell Dimes. Renae Gloss, MD, <Dictator> Colette S. Freida Busman, NP Salley Scarlet MD ELECTRONICALLY SIGNED 08/02/2013 12:19

## 2015-03-24 NOTE — Discharge Summary (Signed)
Dates of Admission and Diagnosis:  Date of Admission 28-Jul-2014   Date of Discharge 01-Jan-0001   Admitting Diagnosis Left pneumonia   Final Diagnosis 1. Left lower lobe pneumonia 2. Acute respiratory failure 3. ARF over CKD3 4. Elevated troponin 5. Hyperkalemia 6. Hypontremia 7. recurrent falls 8. Arthritis 9. Deconditioning    Chief Complaint/History of Present Illness PRIMARY CARE PHYSICIAN:  Jerre Simon, NP, at the [facility].   CHIEF COMPLAINT:  Generalized weakness, chest pain and elevated sugars.   HISTORY OF PRESENT ILLNESS:  Andrea Vaughan is an 81 year old African American female with multiple medical problems, comes to the Emergency Room with a multitude of nonspecific complaints. She has complained of some chest discomfort, prior to coming in had some episode of vomiting after eating today. She also was noted to have elevated sugars in the 500s, and appeared to be somewhat dehydrated. Her creatinine baseline is around 1.5. She came in with creatinine of 2.16.  Per family she has been having frequent falls at home. She uses a walker; however, patient states she feels weak, and her legs give out, her knees have been bothering her. She has some bruises on her elbows and her legs. She denies any other trauma.   During my interview patient was chest pain-free. She received an aspirin in the Emergency Room. Her EKG shows some PVCs. No old EKG for comparison at present. Mild ST-T depression in I, II, V5, V6. Patient does not have any history of cardiac history in the past, per patient and family. She is being admitted for further evaluation and management.   Allergies:  Paxil: Alt Ment Status  Cipro: Hives  Cheese: N/V/Diarrhea  Hepatic:  30-Aug-15 03:38   Bilirubin, Total 0.4  Alkaline Phosphatase 73 (46-116 NOTE: New Reference Range 06/20/14)  SGPT (ALT) 33 (14-63 NOTE: New Reference Range 06/20/14)  SGOT (AST) 26  Total Protein, Serum  4.8  Albumin, Serum  2.0   Routine Micro:  31-Aug-15 09:34   Micro Text Report STOOL COMPREHENSIVE   COMMENT                   NO SALMONELLA OR SHIGELLA ISOLATED   COMMENT                   NO PATHOGENIC E.COLI DETECTED   COMMENT                   NO CAMPYLOBACTER ANTIGEN DETECTED   ANTIBIOTIC                        Culture Comment NO SALMONELLA OR SHIGELLA ISOLATED  Culture Comment . NO PATHOGENIC E.COLI DETECTED  Culture Comment    . NO CAMPYLOBACTER ANTIGEN DETECTED  Result(s) reported on 01 Aug 2014 at 02:43PM.  Routine Chem:  29-Aug-15 01:02   Creatinine (comp)  1.98  eGFR (African American)  26  eGFR (Non-African American)  22 (eGFR values <56mL/min/1.73 m2 may be an indication of chronic kidney disease (CKD). Calculated eGFR is useful in patients with stable renal function. The eGFR calculation will not be reliable in acutely ill patients when serum creatinine is changing rapidly. It is not useful in  patients on dialysis. The eGFR calculation may not be applicable to patients at the low and high extremes of body sizes, pregnant women, and vegetarians.)  Glucose, Serum 70  BUN  45  Sodium, Serum 139  Potassium, Serum 4.3  Chloride, Serum  108  CO2, Serum 24  Calcium (Total), Serum  8.1  Anion Gap 7  Osmolality (calc) 288  Result Comment TROPONIN - RESULTS VERIFIED BY REPEAT TESTING.  - HIGH PREVIOUSLY CALLED BY [facility] AT 2110 ON  - 07-28-14/RWW  Result(s) reported on 29 Jul 2014 at 02:17AM.  30-Aug-15 03:38   Creatinine (comp)  2.34  eGFR (African American)  21  eGFR (Non-African American)  18 (eGFR values <17mL/min/1.73 m2 may be an indication of chronic kidney disease (CKD). Calculated eGFR is useful in patients with stable renal function. The eGFR calculation will not be reliable in acutely ill patients when serum creatinine is changing rapidly. It is not useful in  patients on dialysis. The eGFR calculation may not be applicable to patients at the low and high extremes of body sizes,  pregnant women, and vegetarians.)  Glucose, Serum 95  BUN  33  Sodium, Serum 137  Potassium, Serum 4.4  Chloride, Serum 107  CO2, Serum 21  Calcium (Total), Serum  7.8  Anion Gap 9  Osmolality (calc) 281  Result Comment CBC - SMEAR SCANNED  Result(s) reported on 30 Jul 2014 at 06:19AM.  31-Aug-15 04:27   Creatinine (comp)  2.08  eGFR (African American)  25  eGFR (Non-African American)  21 (eGFR values <34mL/min/1.73 m2 may be an indication of chronic kidney disease (CKD). Calculated eGFR is useful in patients with stable renal function. The eGFR calculation will not be reliable in acutely ill patients when serum creatinine is changing rapidly. It is not useful in  patients on dialysis. The eGFR calculation may not be applicable to patients at the low and high extremes of body sizes, pregnant women, and vegetarians.)  Glucose, Serum  179  BUN  30  Sodium, Serum 137  Potassium, Serum 4.6  Chloride, Serum  108  CO2, Serum 24  Calcium (Total), Serum  7.9  Anion Gap  5  Osmolality (calc) 284  Cardiac:  29-Aug-15 01:02   Troponin I  0.12 (0.00-0.05 0.05 ng/mL or less: NEGATIVE  Repeat testing in 3-6 hrs  if clinically indicated. >0.05 ng/mL: POTENTIAL  MYOCARDIAL INJURY. Repeat  testing in 3-6 hrs if  clinically indicated. NOTE: An increase or decrease  of 30% or more on serial  testing suggests a  clinically important change)  CK, Total 52 (26-192 NOTE: NEW REFERENCE RANGE  01/02/2014)  CPK-MB, Serum  6.0 (Result(s) reported on 29 Jul 2014 at 02:10AM.)  Routine Hem:  30-Aug-15 03:38   WBC (CBC) 7.4  RBC (CBC) 4.08  Hemoglobin (CBC)  10.9  Hematocrit (CBC)  33.6  Platelet Count (CBC) 155  MCV 82  MCH 26.7  MCHC 32.5  RDW  16.6  Neutrophil % 85.8  Lymphocyte % 7.6  Monocyte % 5.2  Eosinophil % 1.3  Basophil % 0.1  Neutrophil # 6.4  Lymphocyte #  0.6  Monocyte # 0.4  Eosinophil # 0.1  Basophil # 0.0   PERTINENT RADIOLOGY STUDIES: XRay:    28-Aug-15  15:32, Chest Portable Single View  Chest Portable Single View   REASON FOR EXAM:    chest pain  COMMENTS:       PROCEDURE: DXR - DXR PORTABLE CHEST SINGLE VIEW  - Jul 28 2014  3:32PM     CLINICAL DATA:  Hypoglycemia.  Vomiting.  Chest pain.    EXAM:  PORTABLE CHEST - 1 VIEW    COMPARISON:  Chest radiograph 07/23/2013    FINDINGS:  Markedly low lung volumes. Consolidative opacity within the left mid  lung. No definite pleural effusion or pneumothorax. Possible tiny  left pleural effusion. No definite pneumothorax. Regional skeleton  is unremarkable.     IMPRESSION:  Markedly low lung volumes.    New consolidative opacity within the left mid lung which may  represent pneumonia in the appropriate clinical setting. Short-term  radiographic followup to resolution is recommended.    Given the limitations of theexam with low lung volumes, PA and  lateral technique chest radiograph may prove helpful.      Electronically Signed    By: Lovey Newcomer M.D.    On: 07/28/2014 15:37         Verified By: Ilsa Iha, M.D.,    28-Aug-15 16:21, Hip Left Complete  Hip Left Complete   REASON FOR EXAM:    fall  COMMENTS:       PROCEDURE: DXR - DXR HIP LEFT COMPLETE  - Jul 28 2014  4:21PM     CLINICAL DATA:  Several episodes of falls complaining of left hip  and knee and lower leg pain with cutaneous bruising over the knee  and lower leg.    EXAM:  LEFT HIP - COMPLETE 2+ VIEW    COMPARISON:  CT images through the pelvis dated March 04, 2012    FINDINGS:  The bony pelvis is osteopenic. There is no acute fracture. There are  degenerative changes of the lower lumbar spine. The sacrum is  intact. There is mild narrowing of the hip joints bilaterally. AP  and lateral views of the left hip reveal no acute fracture or  dislocation.     IMPRESSION:  There is no acute bony abnormality of the pelvis or left hip.      Electronically Signed    By: David  Martinique    On: 07/28/2014 16:25        Verified By: DAVID A. Martinique, M.D., MD    28-Aug-15 16:21, Knee Left Complete  Knee Left Complete   REASON FOR EXAM:    fall  COMMENTS:       PROCEDURE: DXR - DXR KNEE LT COMP WITH OBLIQUES  - Jul 28 2014  4:21PM     CLINICAL DATA:  Left knee pain and bruising status post fall    EXAM:  LEFT KNEE - COMPLETE 4+ VIEW    COMPARISON:  None.    FINDINGS:  There is a prosthetic left knee joint. Radiographic positioning of  the prosthetic components is good. The interface with the native  bone is normal. The native bone is osteopenic. No acute native bone  fracture is demonstrated. There is no joint effusion.     IMPRESSION:  There is no acute abnormality of the prosthetic left knee joint nor  of the native bone.      Electronically Signed    By: David  Martinique    On: 07/28/2014 16:26         Verified By: DAVID A. Martinique, M.D., MD    28-Aug-15 16:21, Tibia And Fibula Left  Tibia And Fibula Left   REASON FOR EXAM:    fall  COMMENTS:       PROCEDURE: DXR - DXR TIBIA AND FIBULA LT (LOWER L  - Jul 28 2014  4:21PM     CLINICAL DATA:  Status post multiple falls now complaining of lower  leg pain and bruising    EXAM:  LEFT TIBIA AND FIBULA - 2 VIEW    COMPARISON:  Left knee series of today's date.  FINDINGS:  The left tibia and fibula are mildly osteopenic diffusely. The  visualized portions of the prosthetic knee joint are intact. There  is no acute fracture of the tibia or fibula. The observed portions  of the ankle are unremarkable. The soft tissues exhibit no acute  abnormalities.     IMPRESSION:  There is no acute bony abnormality of the left tibia or fibula.      Electronically Signed    By: David  Martinique    On: 07/28/2014 16:27         Verified By: DAVID A. Martinique, M.D., MD  LabUnknown:    28-Aug-15 15:32, Chest Portable Single View  PACS Image     28-Aug-15 16:21, Hip Left Complete  PACS Image     28-Aug-15 16:21, Knee Left Complete  PACS  Image     28-Aug-15 16:21, Tibia And Fibula Left  PACS Image    Pertinent Past History:  Pertinent Past History CKD, IDDM, HTN, obsesity, Arthritis, COPD   Hospital Course:  Hospital Course 12 f with IDDM, HTN, CKD3 here with weakness, chest pain  * Left PNA On abx. Afebrile now. On 2 L O2. Wean as tolerated. Duoneb PRN for SOB. d/c vancomycin. Conitnue PO abx  * IDDM with complication of Hypoglycemia Likely from poor intake and infection. Had BS 500 in ED. Stopped D5.  restated lantus 10 Units. BS are fairly controlled now with lantus and SSI.  * Elevated troponin only mild. Likely from demnd ischemia Not NSTEMI  * ARF over CKD- Improving - close to baseline now Due to ATN  * Hyperkalemia and Hyponatremia  Resolved  * Recurrent falls likely from deconditioning SNF at discharge  Stable for discharge.  Time spent on discharge today 40 minutes   Condition on Discharge Fair   Code Status:  Code Status No Code/Do Not Resuscitate   PHYSICAL EXAM ON DISCHARGE:  Physical Exam:  GEN obese   HEENT pale conjunctivae   NECK supple   RESP normal resp effort  clear BS   CARD regular rate   ABD denies tenderness  soft   SKIN Chronic urticaria   PSYCH alert, A+O to time, place, person   VITAL SIGNS:  Vital Signs: **Vital Signs.:   02-Sep-15 04:40  Vital Signs Type Routine  Temperature Temperature (F) 98.7  Celsius 37  Temperature Source oral  Pulse Pulse 94  Respirations Respirations 20  Systolic BP Systolic BP 384  Diastolic BP (mmHg) Diastolic BP (mmHg) 74  Mean BP 90  Pulse Ox % Pulse Ox % 99  Pulse Ox Activity Level  At rest  Oxygen Delivery 2L  Pulse Ox Heart Rate 79   DISCHARGE INSTRUCTIONS HOME MEDS:  Medication Reconciliation: Patient's Home Medications at Discharge:     Medication Instructions  advair diskus 250 mcg-50 mcg inhalation powder  1 puff(s) inhaled 2 times a day   optive - ophthalmic solution  1-2 drop(s) to right eye 3  times a day, As Needed for irritation   omeprazole 20 mg oral delayed release capsule  1 cap(s) orally 2 times a day   voltaren topical 1% topical gel  Apply 2 grams topically to affected area on shoulders every 6 hours, As Needed - for Pain   caltrate 600 with d tablet 600 mg-400 intl units  1 tab(s) orally 2 times a day   lidoderm 5% topical film  Apply 1 patch topically to affected area once a day, As Needed - for Pain   lumigan  0.03% ophthalmic solution  1 drop(s) to each eye once a day (at bedtime)   zofran odt 4 mg oral tablet, disintegrating  1 tab(s) orally every 8 hours, As Needed - for Nausea, Vomiting   metoprolol succinate 25 mg oral tablet, extended release  0.5 tab(s) orally once a day   sarna 0.5%-0.5% topical lotion  Apply topically to affected area 2 times a day, As Needed - for Itching   flonase 50 mcg/inh nasal spray  2 spray(s) nasal once a day, As Needed for allergies   loperamide 2 mg oral capsule  1 cap(s) orally up to 8 times a day, As Needed - for Diarrhea   gabapentin 100 mg oral capsule  1 cap(s) orally 2 times a day   acetaminophen 500 mg oral tablet  2 tab(s) orally 3 times a day, As Needed - for Pain   prozac 40 mg oral capsule  1 cap(s) orally once a day   albuterol 2.5 mg/3 ml (0.083%) inhalation solution  3 milliliter(s) inhaled 3 to 4 times a day   carafate 1 g oral tablet  1 tab(s) orally every 12 hours   ventolin hfa cfc free 90 mcg/inh inhalation aerosol  2 puff(s) inhaled every 4 hours, As Needed for asthma symptoms   aspirin enteric coated 81 mg oral delayed release tablet  1 tab(s) orally once a day   tramadol 50 mg oral tablet  1 tab(s) orally every 8 hours, As Needed - for Pain   prednisone 10 mg oral tablet  1 tab(s) orally once a day   aquaphor - topical ointment  Apply topically to affected area 2 times a day   desitin 13% topical cream  Apply topically to affected area 6 times a day, As Needed for redness   lantus 100 units/ml subcutaneous solution   15 unit(s) subcutaneous once a day (at bedtime)   ativan 0.5 mg oral tablet  0.5 tab(s) orally 2 times a day, As Needed - for Anxiety, Nervousness   amoxicillin-clavulanate 875 mg-125 mg oral tablet  875 milligram(s) orally 2 times a day   acetaminophen-hydrocodone 325 mg-5 mg oral tablet  1 tab(s) orally 2 times a day, As Needed - for Pain before PT    STOP TAKING THE FOLLOWING MEDICATION(S):    hydralazine 10 mg oral tablet: 1 tab(s) orally once a day (at bedtime) nystatin-triamcinolone topical 100000 units/g-0.1% topical cream: Apply topically to affected area 2 times a day until healed  Physician's Instructions:  Diet Low Sodium  Carbohydrate Controlled (ADA) Diet   Diet Consistency Regular Consistency   Activity Limitations As tolerated  with assistance   Return to Work Not Applicable   Time frame for Follow Up Appointment 1-2 days  Rehab physician   Other Comments Accuchecks AC and HS with Novolog SSI   Electronic Signatures: Paulette Rockford, Lottie Dawson (MD)  (Signed 02-Sep-15 10:11)  Authored: ADMISSION DATE AND DIAGNOSIS, CHIEF COMPLAINT/HPI, Allergies, PERTINENT LABS, PERTINENT RADIOLOGY STUDIES, PERTINENT PAST HISTORY, HOSPITAL COURSE, PHYSICAL EXAM ON DISCHARGE, VITAL SIGNS, DISCHARGE INSTRUCTIONS HOME MEDS, PATIENT INSTRUCTIONS   Last Updated: 02-Sep-15 10:11 by Alba Destine (MD)

## 2015-03-24 NOTE — H&P (Signed)
PATIENT NAME:  Andrea Vaughan, Andrea Vaughan MR#:  [id number] DATE OF BIRTH:  Apr 04, 1927  DATE OF ADMISSION:  07/28/2014  PRIMARY CARE PHYSICIAN:  Tillie Fantasia, NP, at the [facility].   CHIEF COMPLAINT:  Generalized weakness, chest pain and elevated sugars.   HISTORY OF PRESENT ILLNESS:  Andrea Vaughan is an 81 year old African American female with multiple medical problems, comes to the Emergency Room with a multitude of nonspecific complaints. She has complained of some chest discomfort, prior to coming in had some episode of vomiting after eating today. She also was noted to have elevated sugars in the 500s, and appeared to be somewhat dehydrated. Her creatinine baseline is around 1.5. She came in with creatinine of 2.16.  Per family she has been having frequent falls at home. She uses a walker; however, patient states she feels weak, and her legs give out, her knees have been bothering her. She has some bruises on her elbows and her legs. She denies any other trauma.   During my interview patient was chest pain-free. She received an aspirin in the Emergency Room. Her EKG shows some PVCs. No old EKG for comparison at present. Mild ST-T depression in I, II, V5, V6. Patient does not have any history of cardiac history in the past, per patient and family. She is being admitted for further evaluation and management.   PAST MEDICAL HISTORY:   1.  Chronic renal failure, CKD stage III, baseline creatinine 1.58 in 2004.  2.  COPD/asthma.  3.  Sleep apnea with oxygen.  4.  Type 2 diabetes on insulin.  5.  Sciatica.   6.  Depression.  7.  CAD.  8.  TIA.  9.  History of breast cancer.  10.  Arthritis.  11.  Hyperlipidemia.  12.  Gastric reflux.  13.  Hypertension.  14.  Left ureteric stent placement in the past.  15.  Cataract extraction.  16.  Bilateral total knee placement.  17.  The patient has history of CAD, which is noted in sunrise; however, does not remember of that.  18.  Patient does have history of  systolic congestive heart failure with echocardiogram in August of 2014, showed EF of 30% to 35% with mild MVR, mild TR, moderate elevated PA pressures.   ALLERGIES:  CIPRO, PAXIL AND CHEESE.   SOCIAL HISTORY:  She lives at home with her daughter; she goes to [employer] on a daily basis; denies any history of smoking or alcohol use.   FAMILY HISTORY:  Sister died of breast cancer.   PAST SURGICAL HISTORY:  Left simple mastectomy with sentinel node biopsy for lobular carcinoma; this was done in April of 2010.   REVIEW OF SYSTEMS:  CONSTITUTIONAL:  No fever, positive fatigue, weakness.  EYES:  No blurred or double vision, positive for cataract.  ENT:  No tinnitus, ear pain, hearing loss.  RESPIRATORY:  No cough, wheeze, hemoptysis, positive for COPD.  CARDIOVASCULAR:  Positive for chest pain and hypertension, and dyspnea on exertion.  GASTROINTESTINAL: Positive for nausea and vomiting, no abdominal pain, GERD.  GENITOURINARY:  No dysuria, hematuria, or frequency.  ENDOCRINE:  No polyuria, nocturia, or thyroid problems.  HEMATOLOGY:  No anemia, easy bruising, or bleeding.  SKIN: No acne or rash, positive for bruises secondary to fall.  MUSCULOSKELETAL:  Positive for knee pain bilaterally and arthritis, no swelling, or gout.  NEUROLOGIC:  No CVA, TIA, or dysarthria.  PSYCHIATRIC:  No anxiety, depression, or bipolar disorder; all other systems reviewed and  negative.   PHYSICAL EXAMINATION: GENERAL:  The patient is awake, alert, oriented x 3, not in acute distress, morbidly obese.  VITAL SIGNS:  Patient's vitals are temperature 98.3, pulse is 75, blood pressure is 165/82; sats are 98% on room air.  HEENT:  Atraumatic, normocephalic, pupils are PERLA, EOM intact, oral mucosa is moist.  NECK:  Supple. No JVD. No carotid bruit. The patient is edentulous.  RESPIRATORY:  Distant breath sounds. No rales, rhonchi, respiratory distress, or labored breathing.  CARDIOVASCULAR:  Both heart sounds are  normal, rate, rhythm, regular; PMI not lateralized, chest nontender, good pedal pulses, cannot appreciate femoral pulses due to morbid obesity.  SKIN: Warm and dry.  EXTREMITIES:  Trace lower edema.  ABDOMEN:  Obese, soft, nontender, no organomegaly, positive bowel sounds.  NEUROLOGIC:  Grossly focal intact and no motor or sensory deficit.  PSYCHIATRIC:  The patient is awake, alert, oriented x 3.  SKIN:  Warm and dry with some bruises in the elbows, and around the knee area.   DIAGNOSTIC DATA:  Cardiac enzymes first set negative, sugars are 467; x-rays of the left hip, left knee, tibia fibula are negative for any fractures; chest x-ray new consolidative opacity in the left mid lung which may represent pneumonia in an appropriate clinical setting, short-term radiographic follow-up with resolution is needed, PH is 7.33, glucose is 579, BUN is 43, creatinine is 2.19, potassium is 5.6, total protein is 5.9, albumin is 2.7, white count is 11.8, H and H is 12.1 and 39.0, troponin is 0.04, UA negative for UTI, lipase is 101, blood sugar on admission was 502.   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  Uncontrolled type 2 diabetes with dehydration acute on chronic renal failure. The patient will be admitted on telemetry floor. We will give her, her Lantus 35 units at bedtime, cover with high dose sliding scale, give her intravenous fluids for hydration, monitor BUN and creatinine, and her sugars with Accu-Chek before meals and at bedtime, and avoid nephrotoxins for acute on chronic renal failure. Her baseline creatinine is around 1.58. Consider nephrology consultation as needed.  2.  Chest pain on and off for last couple of days with increasing shortness of breath, and elevated white count suspected due to left mid lung pneumonia. The patient does not have fever; however, she  has a mildly elevated white count and her symptoms of shortness of breath, and chest pain could be due to pneumonia.  We will treat her with intravenous Rocephin and Zithromax, check blood cultures; we will monitor her white count and her symptoms; incentive spirometer would be helpful for patient.  3.  Hypertension. The patient is to resume her home medications which is hydralazine and metoprolol.  4.  Gastroesophageal reflux disease.  Continue proton pump inhibitors.  5.  Chronic obstructive pulmonary disease/asthma. Continue her inhalers and give DuoNebs as needed.  6.  Chest pain with history of coronary artery disease in the remote past. She has a few risk factors for coronary artery disease.  The patient is chest pain-free right now. We will continue to monitor her symptoms along with cycle cardiac enzymes x 3. Consider cardiology consultation if patient rules in. We will continue aspirin, nitroglycerin p.r.n. and patient is already is on beta blockers.  7.  Deep vein thrombosis prophylaxis with heparin subcutaneous t.i.d.  8.  We will consult physical therapy  since the patient is having frequent falls at home.  9.  Care management for discharge planning.  10.  CODE STATUS: The patient is a NO CODE, DO NOT RESUSCITATE; she carries an out of facility DO NOT RESUSCITATE form. Above was discussed with the patient and family members.    TIME SPENT:  Was 55 minutes.    ____________________________ Wylie Hail Allena Katz, MD sap:nt D: 07/28/2014 18:32:26 ET T: 07/28/2014 21:44:17 ET JOB#: [id number]  cc: Artesia Berkey A. Allena Katz, MD, <Dictator> Willow Ora MD ELECTRONICALLY SIGNED 08/08/2014 14:54

## 2015-04-01 DEATH — deceased
